# Patient Record
Sex: Male | Born: 1955
Health system: Southern US, Community
[De-identification: ages and names within clinical notes are randomized; demographics above are authoritative.]

## PROBLEM LIST (undated history)

## (undated) DIAGNOSIS — B1920 Unspecified viral hepatitis C without hepatic coma: Secondary | ICD-10-CM

---

## 2012-07-14 ENCOUNTER — Emergency Department (HOSPITAL_COMMUNITY): Payer: No Typology Code available for payment source

## 2012-07-14 ENCOUNTER — Encounter (HOSPITAL_COMMUNITY): Payer: Self-pay | Admitting: Emergency Medicine

## 2012-07-14 ENCOUNTER — Emergency Department (HOSPITAL_COMMUNITY)
Admission: EM | Admit: 2012-07-14 | Discharge: 2012-07-14 | Disposition: A | Discharge status: Home / Self Care | Payer: No Typology Code available for payment source | Attending: Emergency Medicine | Admitting: Emergency Medicine

## 2012-07-14 DIAGNOSIS — Y92410 Unspecified street and highway as the place of occurrence of the external cause: Secondary | ICD-10-CM | POA: Insufficient documentation

## 2012-07-14 DIAGNOSIS — IMO0002 Reserved for concepts with insufficient information to code with codable children: Secondary | ICD-10-CM | POA: Insufficient documentation

## 2012-07-14 DIAGNOSIS — S0990XA Unspecified injury of head, initial encounter: Secondary | ICD-10-CM | POA: Insufficient documentation

## 2012-07-14 DIAGNOSIS — F17200 Nicotine dependence, unspecified, uncomplicated: Secondary | ICD-10-CM | POA: Insufficient documentation

## 2012-07-14 DIAGNOSIS — Y9389 Activity, other specified: Secondary | ICD-10-CM | POA: Insufficient documentation

## 2012-07-14 DIAGNOSIS — S0180XA Unspecified open wound of other part of head, initial encounter: Secondary | ICD-10-CM | POA: Insufficient documentation

## 2012-07-14 DIAGNOSIS — S0081XA Abrasion of other part of head, initial encounter: Secondary | ICD-10-CM

## 2012-07-14 DIAGNOSIS — Z8619 Personal history of other infectious and parasitic diseases: Secondary | ICD-10-CM | POA: Insufficient documentation

## 2012-07-14 DIAGNOSIS — S0181XA Laceration without foreign body of other part of head, initial encounter: Secondary | ICD-10-CM

## 2012-07-14 HISTORY — DX: Unspecified viral hepatitis C without hepatic coma: B19.20

## 2012-07-14 MED ORDER — OXYCODONE-ACETAMINOPHEN 5-325 MG PO TABS
2.0000 | ORAL_TABLET | ORAL | Status: DC | PRN
Start: 2012-07-14 — End: 2012-07-22

## 2012-07-14 MED ORDER — OXYCODONE-ACETAMINOPHEN 5-325 MG PO TABS
2.0000 | ORAL_TABLET | Freq: Once | ORAL | Status: AC
  Administered 2012-07-14: 2 via ORAL
  Filled 2012-07-14: qty 2

## 2012-07-14 NOTE — ED Provider Notes (Signed)
LACERATION REPAIR Performed by: Johnnette Gourd Authorized by: Johnnette Gourd Consent: Verbal consent obtained. Risks and benefits: risks, benefits and alternatives were discussed Consent given by: patient Patient identity confirmed: provided demographic data Prepped and Draped in normal sterile fashion Wound explored  Laceration Location: right eyebrow region  Laceration Length: 2 cm  No Foreign Bodies seen or palpated  Anesthesia: local infiltration  Local anesthetic: lidocaine 2% without epinephrine  Anesthetic total: 3 ml  Irrigation method: syringe Amount of cleaning: standard  Skin closure: 5-0 prolene  Number of sutures: 3  Technique: simple interrupted  Patient tolerance: Patient tolerated the procedure well with no immediate complications.   Trevor Mace, PA-C 07/14/12 1715
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
## 2012-07-16 NOTE — ED Provider Notes (Signed)
Medical screening examination/treatment/procedure(s) were conducted as a shared visit with non-physician practitioner(s) and myself.  I personally evaluated the patient during the encounter  Toy Baker, MD 07/16/12 1940

## 2012-07-22 ENCOUNTER — Encounter (HOSPITAL_COMMUNITY): Payer: Self-pay | Admitting: Emergency Medicine

## 2012-07-22 ENCOUNTER — Emergency Department (HOSPITAL_COMMUNITY)
Admission: EM | Admit: 2012-07-22 | Discharge: 2012-07-22 | Disposition: A | Discharge status: Home / Self Care | Payer: Self-pay | Attending: Emergency Medicine | Admitting: Emergency Medicine

## 2012-07-22 DIAGNOSIS — Z4802 Encounter for removal of sutures: Secondary | ICD-10-CM | POA: Insufficient documentation

## 2012-07-22 DIAGNOSIS — Z8619 Personal history of other infectious and parasitic diseases: Secondary | ICD-10-CM | POA: Insufficient documentation

## 2012-07-22 DIAGNOSIS — F17200 Nicotine dependence, unspecified, uncomplicated: Secondary | ICD-10-CM | POA: Insufficient documentation
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
## 2012-07-22 NOTE — ED Provider Notes (Signed)
Medical screening examination/treatment/procedure(s) were performed by non-physician practitioner and as supervising physician I was immediately available for consultation/collaboration.  Flint Melter, MD 07/22/12 2059
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
## 2017-11-03 ENCOUNTER — Other Ambulatory Visit: Payer: Self-pay

## 2017-11-03 ENCOUNTER — Emergency Department (HOSPITAL_COMMUNITY): Payer: 59

## 2017-11-03 ENCOUNTER — Inpatient Hospital Stay (HOSPITAL_COMMUNITY)
Admission: EM | Admit: 2017-11-03 | Discharge: 2017-11-05 | DRG: 194 | Discharge status: Against Medical Advice | Payer: 59 | Attending: Internal Medicine | Admitting: Internal Medicine

## 2017-11-03 ENCOUNTER — Encounter (HOSPITAL_COMMUNITY): Payer: Self-pay | Admitting: Emergency Medicine

## 2017-11-03 DIAGNOSIS — J449 Chronic obstructive pulmonary disease, unspecified: Secondary | ICD-10-CM | POA: Diagnosis present

## 2017-11-03 DIAGNOSIS — R600 Localized edema: Secondary | ICD-10-CM | POA: Diagnosis present

## 2017-11-03 DIAGNOSIS — R0603 Acute respiratory distress: Secondary | ICD-10-CM | POA: Diagnosis present

## 2017-11-03 DIAGNOSIS — J440 Chronic obstructive pulmonary disease with acute lower respiratory infection: Secondary | ICD-10-CM | POA: Diagnosis not present

## 2017-11-03 DIAGNOSIS — F1110 Opioid abuse, uncomplicated: Secondary | ICD-10-CM | POA: Diagnosis present

## 2017-11-03 DIAGNOSIS — J181 Lobar pneumonia, unspecified organism: Principal | ICD-10-CM | POA: Diagnosis present

## 2017-11-03 DIAGNOSIS — J189 Pneumonia, unspecified organism: Secondary | ICD-10-CM | POA: Diagnosis present

## 2017-11-03 DIAGNOSIS — F17210 Nicotine dependence, cigarettes, uncomplicated: Secondary | ICD-10-CM | POA: Diagnosis present

## 2017-11-03 DIAGNOSIS — I340 Nonrheumatic mitral (valve) insufficiency: Secondary | ICD-10-CM | POA: Diagnosis not present

## 2017-11-03 DIAGNOSIS — R0602 Shortness of breath: Secondary | ICD-10-CM | POA: Diagnosis not present

## 2017-11-03 DIAGNOSIS — D638 Anemia in other chronic diseases classified elsewhere: Secondary | ICD-10-CM | POA: Diagnosis present

## 2017-11-03 DIAGNOSIS — D649 Anemia, unspecified: Secondary | ICD-10-CM | POA: Diagnosis not present

## 2017-11-03 DIAGNOSIS — D692 Other nonthrombocytopenic purpura: Secondary | ICD-10-CM | POA: Diagnosis not present

## 2017-11-03 LAB — URINALYSIS, ROUTINE W REFLEX MICROSCOPIC
Bacteria, UA: NONE SEEN
Bilirubin Urine: NEGATIVE
Glucose, UA: NEGATIVE mg/dL
Ketones, ur: NEGATIVE mg/dL
Nitrite: POSITIVE — AB
Protein, ur: NEGATIVE mg/dL
pH: 6 (ref 5.0–8.0)

## 2017-11-03 LAB — COMPREHENSIVE METABOLIC PANEL
ALT: 43 U/L (ref 0–44)
AST: 48 U/L — ABNORMAL HIGH (ref 15–41)
Albumin: 2.8 g/dL — ABNORMAL LOW (ref 3.5–5.0)
Alkaline Phosphatase: 82 U/L (ref 38–126)
Anion gap: 4 — ABNORMAL LOW (ref 5–15)
BUN: 13 mg/dL (ref 8–23)
CHLORIDE: 99 mmol/L (ref 98–111)
CO2: 30 mmol/L (ref 22–32)
CREATININE: 0.79 mg/dL (ref 0.61–1.24)
Calcium: 8.3 mg/dL — ABNORMAL LOW (ref 8.9–10.3)
Glucose, Bld: 132 mg/dL — ABNORMAL HIGH (ref 70–99)
POTASSIUM: 4.2 mmol/L (ref 3.5–5.1)
Sodium: 133 mmol/L — ABNORMAL LOW (ref 135–145)
Total Bilirubin: 0.3 mg/dL (ref 0.3–1.2)
Total Protein: 7.1 g/dL (ref 6.5–8.1)

## 2017-11-03 LAB — CREATININE, SERUM: CREATININE: 0.71 mg/dL (ref 0.61–1.24)

## 2017-11-03 LAB — CBC WITH DIFFERENTIAL/PLATELET
Abs Immature Granulocytes: 0.03 10*3/uL (ref 0.00–0.07)
BASOS PCT: 1 %
Basophils Absolute: 0 10*3/uL (ref 0.0–0.1)
EOS ABS: 0.1 10*3/uL (ref 0.0–0.5)
EOS PCT: 1 %
HCT: 33.9 % — ABNORMAL LOW (ref 39.0–52.0)
Hemoglobin: 10.2 g/dL — ABNORMAL LOW (ref 13.0–17.0)
Immature Granulocytes: 1 %
Lymphocytes Relative: 35 %
Lymphs Abs: 2.3 10*3/uL (ref 0.7–4.0)
MCH: 29.8 pg (ref 26.0–34.0)
MCHC: 30.1 g/dL (ref 30.0–36.0)
MCV: 99.1 fL (ref 80.0–100.0)
MONOS PCT: 11 %
Monocytes Absolute: 0.7 10*3/uL (ref 0.1–1.0)
Neutro Abs: 3.4 10*3/uL (ref 1.7–7.7)
Neutrophils Relative %: 51 %
PLATELETS: 371 10*3/uL (ref 150–400)
RBC: 3.42 MIL/uL — ABNORMAL LOW (ref 4.22–5.81)
RDW: 14.6 % (ref 11.5–15.5)
WBC: 6.6 10*3/uL (ref 4.0–10.5)
nRBC: 0 % (ref 0.0–0.2)

## 2017-11-03 LAB — CBC
HEMATOCRIT: 33.1 % — AB (ref 39.0–52.0)
Hemoglobin: 10.5 g/dL — ABNORMAL LOW (ref 13.0–17.0)
MCH: 30.6 pg (ref 26.0–34.0)
MCHC: 31.7 g/dL (ref 30.0–36.0)
MCV: 96.5 fL (ref 80.0–100.0)
Platelets: 362 10*3/uL (ref 150–400)
RBC: 3.43 MIL/uL — ABNORMAL LOW (ref 4.22–5.81)
RDW: 14.7 % (ref 11.5–15.5)
WBC: 6.9 10*3/uL (ref 4.0–10.5)
nRBC: 0 % (ref 0.0–0.2)

## 2017-11-03 LAB — PROTIME-INR
INR: 1.08
Prothrombin Time: 13.9 seconds (ref 11.4–15.2)

## 2017-11-03 LAB — BRAIN NATRIURETIC PEPTIDE: B NATRIURETIC PEPTIDE 5: 49.1 pg/mL (ref 0.0–100.0)

## 2017-11-03 LAB — I-STAT CG4 LACTIC ACID, ED: Lactic Acid, Venous: 0.96 mmol/L (ref 0.5–1.9)

## 2017-11-03 MED ORDER — HEPARIN SODIUM (PORCINE) 5000 UNIT/ML IJ SOLN
5000.0000 [IU] | Freq: Three times a day (TID) | INTRAMUSCULAR | Status: DC
  Administered 2017-11-03 – 2017-11-04 (×2): 5000 [IU] via SUBCUTANEOUS
  Filled 2017-11-03 (×2): qty 1

## 2017-11-03 MED ORDER — IOPAMIDOL (ISOVUE-370) INJECTION 76%
INTRAVENOUS | Status: AC
  Filled 2017-11-03: qty 100

## 2017-11-03 MED ORDER — SODIUM CHLORIDE 0.9 % IV BOLUS
1000.0000 mL | Freq: Once | INTRAVENOUS | Status: AC
  Administered 2017-11-03: 1000 mL via INTRAVENOUS

## 2017-11-03 MED ORDER — LORAZEPAM 1 MG PO TABS: 1.0000 mg | ORAL_TABLET | ORAL | Status: DC | PRN

## 2017-11-03 MED ORDER — ONDANSETRON 4 MG PO TBDP: 4.0000 mg | ORAL_TABLET | Freq: Once | ORAL | Status: DC

## 2017-11-03 MED ORDER — SODIUM CHLORIDE 0.9 % IV SOLN
500.0000 mg | Freq: Once | INTRAVENOUS | Status: AC
  Administered 2017-11-03: 500 mg via INTRAVENOUS
  Filled 2017-11-03: qty 500

## 2017-11-03 MED ORDER — SODIUM CHLORIDE 0.9 % IV SOLN
1.0000 g | INTRAVENOUS | Status: DC
  Administered 2017-11-04: 1 g via INTRAVENOUS
  Filled 2017-11-03: qty 10

## 2017-11-03 MED ORDER — SODIUM CHLORIDE 0.9 % IV SOLN
1.0000 g | Freq: Once | INTRAVENOUS | Status: AC
  Administered 2017-11-03: 1 g via INTRAVENOUS
  Filled 2017-11-03: qty 10

## 2017-11-03 MED ORDER — IOPAMIDOL (ISOVUE-370) INJECTION 76%
100.0000 mL | Freq: Once | INTRAVENOUS | Status: AC | PRN
  Administered 2017-11-03: 100 mL via INTRAVENOUS

## 2017-11-03 MED ORDER — SODIUM CHLORIDE 0.9 % IV SOLN
500.0000 mg | INTRAVENOUS | Status: DC
  Administered 2017-11-04: 500 mg via INTRAVENOUS
  Filled 2017-11-03: qty 500

## 2017-11-03 MED ORDER — SODIUM CHLORIDE 0.9 % IV SOLN
INTRAVENOUS | Status: DC
  Administered 2017-11-03: via INTRAVENOUS

## 2017-11-03 NOTE — ED Triage Notes (Addendum)
C/o edema in legs from toes to hips- increasing shortness of breathing. Pale, purpura on legs Admits to using heroin every 6 hours, snorting - was clean for 11 yrs-

## 2017-11-03 NOTE — H&P (Signed)
History and Physical   Jose Shepherd ZOX:[id number] DOB: 02-02-1955 DOA: 11/03/2017  Referring MD/NP/PA: Dr. Alona Bene  PCP: Patient, No Pcp Per   Patient coming from: Home  Chief Complaint: Lower extremity edema  HPI: Jose Shepherd is a 62 y.o. male with medical history significant of hepatitis C and chronic heroin abuse who presented to the ER with progressive bilateral lower extremity edema all the way to the thigh.  Symptoms started a few weeks ago and has gotten worse.  Associated with some mild shortness of breath.  No fever or chills.  Patient is unable to walk around the house or even in the hospital.  Wife is worried that he is not been able to function adequately.  He denied any recent injury.  No history of heart disease.  No history of kidney disease as well.  Patient has had previous swelling of only one leg but now is bilateral and symmetrical.  Patient has been using heroine in the past.  He was clean for more than 10 years but then started back in June of this year and has been hooked up on it.  He has tried detox but has failed.  ED Course: Temperature is 98.3 blood pressure 101/53 pulse 74 respirate of 16 oxygen sat 90% on room air.  White count is 6.9 hemoglobin 10.5 and platelet 362 sodium 133 potassium 4.2 chloride 99 CO2 30 with BUN 13 and calcium 8.3 INR 1.08 and glucose 132 lactic acid 0.96.  CT angiogram of the chest showed no PE but Mucous plugging within the lower lobe airways bilaterally. Area of consolidation in the posterior left upper lobe/lingula compatible with early pneumonia.  Albumin is 2.8 otherwise LFTs appear to be within normal.  proBNP is only 49.  Patient is therefore being admitted with possible pneumonia.  He has received initial dose of antibiotic in the ER.  Review of Systems: As per HPI otherwise 10 point review of systems negative.    Past Medical History:  Diagnosis Date  . Hepatitis C     No past surgical history on file.   reports that he has  been smoking. He has never used smokeless tobacco. He reports that he has current or past drug history. He reports that he does not drink alcohol.  No Known Allergies  No family history on file.   Prior to Admission medications   Not on File    Physical Exam: Vitals:   11/03/17 1930 11/03/17 2000 11/03/17 2030 11/03/17 2215  BP: (!) 119/59 (!) 102/59 (!) 101/53 (!) 113/55  Pulse: 71 63 70 66  Resp: 13 12 11 16   Temp:      TempSrc:      SpO2: 96% 96% 90% 93%      Constitutional: NAD, calm, comfortable, cachectic Vitals:   11/03/17 1930 11/03/17 2000 11/03/17 2030 11/03/17 2215  BP: (!) 119/59 (!) 102/59 (!) 101/53 (!) 113/55  Pulse: 71 63 70 66  Resp: 13 12 11 16   Temp:      TempSrc:      SpO2: 96% 96% 90% 93%   Eyes: PERRL, lids and conjunctivae normal ENMT: Mucous membranes are moist. Posterior pharynx clear of any exudate or lesions.Normal dentition.  Neck: normal, supple, no masses, no thyromegaly Respiratory: clear to auscultation bilaterally, no wheezing, no crackles. Normal respiratory effort. No accessory muscle use.  Cardiovascular: Regular rate and rhythm, no murmurs / rubs / gallops. 3+ extremity edema. 2+ pedal pulses. No carotid bruits.  Abdomen: no tenderness,  no masses palpated. No hepatosplenomegaly. Bowel sounds positive.  Musculoskeletal: no clubbing / cyanosis. No joint deformity upper and lower extremities. Good ROM, no contractures. Normal muscle tone.  Skin: no rashes, lesions, ulcers. No induration Neurologic: CN 2-12 grossly intact. Sensation intact, DTR normal. Strength 5/5 in all 4.  Psychiatric: Normal judgment and insight. Alert and oriented x 3. Normal mood.     Labs on Admission: I have personally reviewed following labs and imaging studies  CBC: Recent Labs  Lab 11/03/17 1441 11/03/17 2245  WBC 6.6 6.9  NEUTROABS 3.4  --   HGB 10.2* 10.5*  HCT 33.9* 33.1*  MCV 99.1 96.5  PLT 371 362   Basic Metabolic Panel: Recent Labs  Lab  11/03/17 1441 11/03/17 2245  NA 133*  --   K 4.2  --   CL 99  --   CO2 30  --   GLUCOSE 132*  --   BUN 13  --   CREATININE 0.79 0.71  CALCIUM 8.3*  --    GFR: CrCl cannot be calculated (Unknown ideal weight.). Liver Function Tests: Recent Labs  Lab 11/03/17 1441  AST 48*  ALT 43  ALKPHOS 82  BILITOT 0.3  PROT 7.1  ALBUMIN 2.8*   No results for input(s): LIPASE, AMYLASE in the last 168 hours. No results for input(s): AMMONIA in the last 168 hours. Coagulation Profile: Recent Labs  Lab 11/03/17 1753  INR 1.08   Cardiac Enzymes: No results for input(s): CKTOTAL, CKMB, CKMBINDEX, TROPONINI in the last 168 hours. BNP (last 3 results) No results for input(s): PROBNP in the last 8760 hours. HbA1C: No results for input(s): HGBA1C in the last 72 hours. CBG: No results for input(s): GLUCAP in the last 168 hours. Lipid Profile: No results for input(s): CHOL, HDL, LDLCALC, TRIG, CHOLHDL, LDLDIRECT in the last 72 hours. Thyroid Function Tests: No results for input(s): TSH, T4TOTAL, FREET4, T3FREE, THYROIDAB in the last 72 hours. Anemia Panel: No results for input(s): VITAMINB12, FOLATE, FERRITIN, TIBC, IRON, RETICCTPCT in the last 72 hours. Urine analysis:    Component Value Date/Time   COLORURINE YELLOW 11/03/2017 1436   APPEARANCEUR HAZY (A) 11/03/2017 1436   LABSPEC >1.046 (H) 11/03/2017 1436   PHURINE 6.0 11/03/2017 1436   GLUCOSEU NEGATIVE 11/03/2017 1436   HGBUR SMALL (A) 11/03/2017 1436   BILIRUBINUR NEGATIVE 11/03/2017 1436   KETONESUR NEGATIVE 11/03/2017 1436   PROTEINUR NEGATIVE 11/03/2017 1436   NITRITE POSITIVE (A) 11/03/2017 1436   LEUKOCYTESUR LARGE (A) 11/03/2017 1436   Sepsis Labs: @LABRCNTIP (procalcitonin:4,lacticidven:4) )No results found for this or any previous visit (from the past 240 hour(s)).   Radiological Exams on Admission: Dg Chest 2 View  Result Date: 11/03/2017 CLINICAL DATA:  Shortness of breath EXAM: CHEST - 2 VIEW COMPARISON:   None. FINDINGS: Hyperinflation with coarse bilateral interstitial opacity, likely mild chronic interstitial change. Left lower lung opacity suspected to represent a lead external to the patient. No focal consolidation or effusion. Normal heart size. No pneumothorax. IMPRESSION: No active cardiopulmonary disease. Hyperinflation with suspected coarse chronic interstitial disease. Electronically Signed   By: Jasmine Pang M.D.   On: 11/03/2017 15:41   Ct Angio Chest Pe W/cm &/or Wo Cm  Result Date: 11/03/2017 CLINICAL DATA:  Shortness of breath EXAM: CT ANGIOGRAPHY CHEST WITH CONTRAST TECHNIQUE: Multidetector CT imaging of the chest was performed using the standard protocol during bolus administration of intravenous contrast. Multiplanar CT image reconstructions and MIPs were obtained to evaluate the vascular anatomy. CONTRAST:  ISOVUE-370  IOPAMIDOL (ISOVUE-370) INJECTION 76% COMPARISON:  Chest x-ray today FINDINGS: Cardiovascular: For no filling defects in the pulmonary arteries to suggest pulmonary emboli. Heart is normal size. Aorta is normal caliber. Mediastinum/Nodes: No mediastinal, hilar, or axillary adenopathy. Lungs/Pleura: Mucous plugging within bilateral lower lobe airways. Moderate emphysema. Airspace consolidation in the posterior left lingula, likely early infiltrate/pneumonia. No effusions. Upper Abdomen: Imaging into the upper abdomen shows no acute findings. Musculoskeletal: Chest wall soft tissues are unremarkable. No acute bony abnormality. Review of the MIP images confirms the above findings. IMPRESSION: No evidence of pulmonary embolus. Mucous plugging within the lower lobe airways bilaterally. Area of consolidation in the posterior left upper lobe/lingula compatible with early pneumonia. Emphysema (ICD10-J43.9). Electronically Signed   By: Charlett Nose M.D.   On: 11/03/2017 19:29      Assessment/Plan Principal Problem:   Pneumonia Active Problems:   COPD (chronic obstructive  pulmonary disease) (HCC)   Edema of both legs   Anemia   Heroin abuse (HCC)     #1 pneumonia: Early pneumonia according to CT.  Empirically started on Rocephin and Zithromax.  Patient not very hypoxic.  Has significant COPD.  Continued treatment and transition to oral antibiotics.  #2 bilateral lower extremity edema: All the way to the thigh.  Patient will be evaluated for course.  We will get echocardiogram.  May need to rule out pelvic obstruction as bilateral lower extremity edema is extensive.  Liver does not appear to be implicated despite hepatitis C.  Initiate diuresis.  Elevate the legs bilaterally.  #3 COPD: No exacerbation.  Empiric nebulizer.  #4 anemia of chronic disease: Monitor H&H.  #5 heroin abuse: Monitor patient for possible withdrawal.  PRN Ativan  #6 history of hepatitis C: Per patient it is in remission.  No evidence of liver damage at this point.  Defer treatment to the outpatient setting.   DVT prophylaxis: Heparin Code Status: Full Family Communication: Wife that is with the patient Disposition Plan: To be determined Consults called: None Admission status: Inpatient  Severity of Illness: The appropriate patient status for this patient is INPATIENT. Inpatient status is judged to be reasonable and necessary in order to provide the required intensity of service to ensure the patient's safety. The patient's presenting symptoms, physical exam findings, and initial radiographic and laboratory data in the context of their chronic comorbidities is felt to place them at high risk for further clinical deterioration. Furthermore, it is not anticipated that the patient will be medically stable for discharge from the hospital within 2 midnights of admission. The following factors support the patient status of inpatient.   " The patient's presenting symptoms include shortness of breath and lower extremity edema. " The worrisome physical exam findings include massive 3+ pedal  edema up to the thigh. " The initial radiographic and laboratory data are worrisome because of evidence of pneumonia on chest x-ray. " The chronic co-morbidities include heroine abuse.   * I certify that at the point of admission it is my clinical judgment that the patient will require inpatient hospital care spanning beyond 2 midnights from the point of admission due to high intensity of service, high risk for further deterioration and high frequency of surveillance required.Lonia Blood MD [facility] Pager [phone]  If 7PM-7AM, please contact night-coverage [email or web address] Password [email or web address]  11/04/2017, 12:05 AM

## 2017-11-03 NOTE — ED Provider Notes (Signed)
Patient placed in Quick Look pathway, seen and evaluated   Chief Complaint: edema  HPI: Jose Shepherd is a 62 y.o. male who presents to the ED with bilateral lower extremity edema, shortness of breath, purple rash to upper legs, and facial swelling. Patient with hx of Hep. C. And heroin use. Patient reports being clean for 11 years but then started back in June 2019. He uses every 6 hours. Tried to detox at home but got really bad and started back.   ROS: Resp: shortness of breath   M/S; lower extremity swelling  Physical Exam:  BP 105/62 (BP Location: Left Arm)   Pulse 74   Temp 98.3 F (36.8 C) (Oral)   Resp 16   SpO2 92%    Gen: No distress  Neuro: Awake and Alert  Skin: pale, purpura upper legs  M/S: bilateral lower extremities with edema,   Initiation of care has begun. The patient has been counseled on the process, plan, and necessity for staying for the completion/evaluation, and the remainder of the medical screening examination    Janne Napoleon, NP 11/03/17 1444    Maia Plan, MD 11/03/17 [phone]

## 2017-11-04 ENCOUNTER — Inpatient Hospital Stay (HOSPITAL_COMMUNITY): Payer: 59

## 2017-11-04 DIAGNOSIS — J449 Chronic obstructive pulmonary disease, unspecified: Secondary | ICD-10-CM

## 2017-11-04 DIAGNOSIS — D649 Anemia, unspecified: Secondary | ICD-10-CM

## 2017-11-04 DIAGNOSIS — F1110 Opioid abuse, uncomplicated: Secondary | ICD-10-CM

## 2017-11-04 DIAGNOSIS — J181 Lobar pneumonia, unspecified organism: Principal | ICD-10-CM

## 2017-11-04 DIAGNOSIS — I340 Nonrheumatic mitral (valve) insufficiency: Secondary | ICD-10-CM

## 2017-11-04 DIAGNOSIS — R600 Localized edema: Secondary | ICD-10-CM

## 2017-11-04 LAB — ECHOCARDIOGRAM COMPLETE
Height: 71 in
Weight: 2320 oz

## 2017-11-04 LAB — COMPREHENSIVE METABOLIC PANEL
ALT: 40 U/L (ref 0–44)
AST: 39 U/L (ref 15–41)
Albumin: 2.4 g/dL — ABNORMAL LOW (ref 3.5–5.0)
Alkaline Phosphatase: 78 U/L (ref 38–126)
Anion gap: 6 (ref 5–15)
BUN: 10 mg/dL (ref 8–23)
CO2: 27 mmol/L (ref 22–32)
CREATININE: 0.65 mg/dL (ref 0.61–1.24)
Calcium: 8 mg/dL — ABNORMAL LOW (ref 8.9–10.3)
Chloride: 100 mmol/L (ref 98–111)
GFR calc Af Amer: >60 mL/min (ref >60)
Glucose, Bld: 124 mg/dL — ABNORMAL HIGH (ref 70–99)
Potassium: 4.1 mmol/L (ref 3.5–5.1)
SODIUM: 133 mmol/L — AB (ref 135–145)
Total Bilirubin: 0.3 mg/dL (ref 0.3–1.2)
Total Protein: 6.7 g/dL (ref 6.5–8.1)

## 2017-11-04 LAB — STREP PNEUMONIAE URINARY ANTIGEN: Strep Pneumo Urinary Antigen: NEGATIVE

## 2017-11-04 LAB — HIV ANTIBODY (ROUTINE TESTING W REFLEX): HIV Screen 4th Generation wRfx: NONREACTIVE

## 2017-11-04 MED ORDER — IPRATROPIUM-ALBUTEROL 0.5-2.5 (3) MG/3ML IN SOLN
3.0000 mL | Freq: Four times a day (QID) | RESPIRATORY_TRACT | Status: DC
  Administered 2017-11-04: 3 mL via RESPIRATORY_TRACT
  Filled 2017-11-04 (×3): qty 3

## 2017-11-04 MED ORDER — IPRATROPIUM-ALBUTEROL 0.5-2.5 (3) MG/3ML IN SOLN
3.0000 mL | Freq: Two times a day (BID) | RESPIRATORY_TRACT | Status: DC
  Administered 2017-11-05: 3 mL via RESPIRATORY_TRACT
  Filled 2017-11-04: qty 3

## 2017-11-04 MED ORDER — FUROSEMIDE 10 MG/ML IJ SOLN
40.0000 mg | Freq: Three times a day (TID) | INTRAMUSCULAR | Status: DC
  Administered 2017-11-04 – 2017-11-05 (×3): 40 mg via INTRAVENOUS
  Filled 2017-11-04 (×3): qty 4

## 2017-11-04 MED ORDER — ENOXAPARIN SODIUM 40 MG/0.4ML ~~LOC~~ SOLN
40.0000 mg | SUBCUTANEOUS | Status: DC
  Administered 2017-11-04: 40 mg via SUBCUTANEOUS
  Filled 2017-11-04 (×2): qty 0.4

## 2017-11-04 MED ORDER — BUDESONIDE 0.5 MG/2ML IN SUSP
0.5000 mg | Freq: Two times a day (BID) | RESPIRATORY_TRACT | Status: DC
  Administered 2017-11-04 – 2017-11-05 (×2): 0.5 mg via RESPIRATORY_TRACT
  Filled 2017-11-04 (×3): qty 2

## 2017-11-04 MED ORDER — FUROSEMIDE 10 MG/ML IJ SOLN
40.0000 mg | Freq: Two times a day (BID) | INTRAMUSCULAR | Status: DC
  Administered 2017-11-04: 40 mg via INTRAVENOUS
  Filled 2017-11-04: qty 4

## 2017-11-04 MED ORDER — IPRATROPIUM-ALBUTEROL 0.5-2.5 (3) MG/3ML IN SOLN: 3.0000 mL | RESPIRATORY_TRACT | Status: DC | PRN

## 2017-11-04 NOTE — Progress Notes (Signed)
PROGRESS NOTE    Jose Shepherd  NFA:[id number] DOB: 06-17-1955 DOA: 11/03/2017 PCP: Patient, No Pcp Per   Brief Narrative:  62 year old with history of hepatitis C and chronic heroin use came to the hospital complains of progressive bilateral lower extremity edema all the way up to his thighs which had been getting worse over the course of past several days along with exertional shortness of breath.  In the ER his vital signs were overall unremarkable but CTA of the chest was negative for pulmonary embolism but showed mucous plugging with concerns of possible early pneumonia.  There is clinical signs of volume overload therefore started on IV Lasix.   Assessment & Plan:   Principal Problem:   Pneumonia Active Problems:   COPD (chronic obstructive pulmonary disease) (HCC)   Edema of both legs   Anemia   Heroin abuse (HCC)  Acute respiratory distress, exertional Bilateral lower extremity edema Community-acquired pneumonia with underlying COPD - Continue patient on IV Rocephin or azithromycin -We will add nebulizer treatments scheduled and as necessary, Pulmicort - Incentive spirometry and flutter valve.  No need for oral steroids at this time - Check 2D echocardiogram, lower extremity Dopplers - Increase Lasix to 40 mg every 8 hours.  Monitor urine output. -Daily weights, strict input and output, fluid restriction 1800 cc -Blood cultures-pending - HIV-pending - Urine strep pneumonia-pending  Tobacco use and heroin use -Counseled to quit using tobacco and illicit drugs.  Anemia -Unspecified type, will defer this work-up as outpatient.  DVT prophylaxis: Subcutaneous Lovenox Code Status: Full code Family Communication: Wife and son at bedside Disposition Plan: Maintain inpatient stay for further IV diuresis and IV antibiotics.  He also needs continues bronchodilators.  Currently undergoing work-up for cause of fluid overload, needs echocardiogram and lower extremity  Dopplers  Consultants:   None  Procedures:   None  Antimicrobials:   Rocephin  Azithromycin   Subjective: Patient continues to insist he wants to go home but after extensively speaking with him with the help of his family patient is willing to stay in the hospital for further evaluation.  Does continue to report lower extremity tightness and swelling bilaterally.  Review of Systems Otherwise negative except as per HPI, including: General: Denies fever, chills, night sweats or unintended weight loss. Resp: Denies cough, wheezing, shortness of breath. Cardiac: Denies chest pain, palpitations, orthopnea, paroxysmal nocturnal dyspnea. GI: Denies abdominal pain, nausea, vomiting, diarrhea or constipation GU: Denies dysuria, frequency, hesitancy or incontinence MS: Denies muscle aches, joint pain or swelling Neuro: Denies headache, neurologic deficits (focal weakness, numbness, tingling), abnormal gait Psych: Denies anxiety, depression, SI/HI/AVH Skin: Denies new rashes or lesions ID: Denies sick contacts, exotic exposures, travel  Objective: Vitals:   11/03/17 2030 11/03/17 2215 11/04/17 0432 11/04/17 0618  BP: (!) 101/53 (!) 113/55 (!) 90/58 92/61  Pulse: 70 66 65 (!) 55  Resp: 11 16 16    Temp:  98.3 F (36.8 C) 97.7 F (36.5 C)   TempSrc:  Oral Oral   SpO2: 90% 93% 96%   Weight:  65.8 kg    Height:  5\' 11"  (1.803 m)      Intake/Output Summary (Last 24 hours) at 11/04/2017 1000 Last data filed at 11/04/2017 1000 Gross per 24 hour  Intake 637.97 ml  Output 460 ml  Net 177.97 ml   Filed Weights   11/03/17 2215  Weight: 65.8 kg    Examination:  General exam: Appears calm and comfortable  Respiratory system: Diffuse diminished breath sounds Cardiovascular system:  S1 & S2 heard, RRR. No JVD, murmurs, rubs, gallops or clicks.  Bilateral 3+ pitting edema up to his thighs with slight erythema but no warmth Gastrointestinal system: Abdomen is nondistended, soft and  nontender. No organomegaly or masses felt. Normal bowel sounds heard. Central nervous system: Alert and oriented. No focal neurological deficits. Extremities: Symmetric 5 x 5 power. Skin: Slight erythema bilateral lower extremity but no warmth. Psychiatry: Judgement and insight appear normal. Mood & affect appropriate.     Data Reviewed:   CBC: Recent Labs  Lab 11/03/17 1441 11/03/17 2245  WBC 6.6 6.9  NEUTROABS 3.4  --   HGB 10.2* 10.5*  HCT 33.9* 33.1*  MCV 99.1 96.5  PLT 371 362   Basic Metabolic Panel: Recent Labs  Lab 11/03/17 1441 11/03/17 2245 11/04/17 0004  NA 133*  --  133*  K 4.2  --  4.1  CL 99  --  100  CO2 30  --  27  GLUCOSE 132*  --  124*  BUN 13  --  10  CREATININE 0.79 0.71 0.65  CALCIUM 8.3*  --  8.0*   GFR: Estimated Creatinine Clearance: 90.2 mL/min (by C-G formula based on SCr of 0.65 mg/dL). Liver Function Tests: Recent Labs  Lab 11/03/17 1441 11/04/17 0004  AST 48* 39  ALT 43 40  ALKPHOS 82 78  BILITOT 0.3 0.3  PROT 7.1 6.7  ALBUMIN 2.8* 2.4*   No results for input(s): LIPASE, AMYLASE in the last 168 hours. No results for input(s): AMMONIA in the last 168 hours. Coagulation Profile: Recent Labs  Lab 11/03/17 1753  INR 1.08   Cardiac Enzymes: No results for input(s): CKTOTAL, CKMB, CKMBINDEX, TROPONINI in the last 168 hours. BNP (last 3 results) No results for input(s): PROBNP in the last 8760 hours. HbA1C: No results for input(s): HGBA1C in the last 72 hours. CBG: No results for input(s): GLUCAP in the last 168 hours. Lipid Profile: No results for input(s): CHOL, HDL, LDLCALC, TRIG, CHOLHDL, LDLDIRECT in the last 72 hours. Thyroid Function Tests: No results for input(s): TSH, T4TOTAL, FREET4, T3FREE, THYROIDAB in the last 72 hours. Anemia Panel: No results for input(s): VITAMINB12, FOLATE, FERRITIN, TIBC, IRON, RETICCTPCT in the last 72 hours. Sepsis Labs: Recent Labs  Lab 11/03/17 1800  LATICACIDVEN 0.96    No  results found for this or any previous visit (from the past 240 hour(s)).       Radiology Studies: Dg Chest 2 View  Result Date: 11/03/2017 CLINICAL DATA:  Shortness of breath EXAM: CHEST - 2 VIEW COMPARISON:  None. FINDINGS: Hyperinflation with coarse bilateral interstitial opacity, likely mild chronic interstitial change. Left lower lung opacity suspected to represent a lead external to the patient. No focal consolidation or effusion. Normal heart size. No pneumothorax. IMPRESSION: No active cardiopulmonary disease. Hyperinflation with suspected coarse chronic interstitial disease. Electronically Signed   By: Jasmine Pang M.D.   On: 11/03/2017 15:41   Ct Angio Chest Pe W/cm &/or Wo Cm  Result Date: 11/03/2017 CLINICAL DATA:  Shortness of breath EXAM: CT ANGIOGRAPHY CHEST WITH CONTRAST TECHNIQUE: Multidetector CT imaging of the chest was performed using the standard protocol during bolus administration of intravenous contrast. Multiplanar CT image reconstructions and MIPs were obtained to evaluate the vascular anatomy. CONTRAST:  ISOVUE-370 IOPAMIDOL (ISOVUE-370) INJECTION 76% COMPARISON:  Chest x-ray today FINDINGS: Cardiovascular: For no filling defects in the pulmonary arteries to suggest pulmonary emboli. Heart is normal size. Aorta is normal caliber. Mediastinum/Nodes: No mediastinal, hilar, or  axillary adenopathy. Lungs/Pleura: Mucous plugging within bilateral lower lobe airways. Moderate emphysema. Airspace consolidation in the posterior left lingula, likely early infiltrate/pneumonia. No effusions. Upper Abdomen: Imaging into the upper abdomen shows no acute findings. Musculoskeletal: Chest wall soft tissues are unremarkable. No acute bony abnormality. Review of the MIP images confirms the above findings. IMPRESSION: No evidence of pulmonary embolus. Mucous plugging within the lower lobe airways bilaterally. Area of consolidation in the posterior left upper lobe/lingula compatible with  early pneumonia. Emphysema (ICD10-J43.9). Electronically Signed   By: Charlett Nose M.D.   On: 11/03/2017 19:29        Scheduled Meds: . budesonide (PULMICORT) nebulizer solution  0.5 mg Nebulization BID  . furosemide  40 mg Intravenous Q8H  . heparin  5,000 Units Subcutaneous Q8H  . ipratropium-albuterol  3 mL Nebulization Q6H  . ondansetron  4 mg Oral Once   Continuous Infusions: . azithromycin    . cefTRIAXone (ROCEPHIN)  IV       LOS: 1 day   Time spent= 40 mins    Ankit Joline Maxcy, MD [facility] Pager [phone]   If 7PM-7AM, please contact night-coverage [email or web address] Password [email or web address] 11/04/2017, 10:00 AM

## 2017-11-05 ENCOUNTER — Encounter (HOSPITAL_COMMUNITY): Payer: 59

## 2017-11-05 LAB — BASIC METABOLIC PANEL
Anion gap: 6 (ref 5–15)
BUN: 10 mg/dL (ref 8–23)
CHLORIDE: 100 mmol/L (ref 98–111)
CO2: 31 mmol/L (ref 22–32)
Calcium: 7.9 mg/dL — ABNORMAL LOW (ref 8.9–10.3)
Creatinine, Ser: 0.73 mg/dL (ref 0.61–1.24)
GFR calc non Af Amer: >60 mL/min (ref >60)
Glucose, Bld: 114 mg/dL — ABNORMAL HIGH (ref 70–99)
POTASSIUM: 3.9 mmol/L (ref 3.5–5.1)
SODIUM: 137 mmol/L (ref 135–145)

## 2017-11-05 LAB — MAGNESIUM: Magnesium: 1.9 mg/dL (ref 1.7–2.4)

## 2017-11-05 NOTE — Discharge Summary (Signed)
Physician Discharge Summary  Jose Shepherd ZOX:[id number] DOB: Sep 23, 1955 DOA: 11/03/2017  PCP: Patient, No Pcp Per  Admit date: 11/03/2017 Discharge date: 11/05/2017  Admitted From: Home Disposition: Left AMA No medications given at the time of discharge as patient did not want it.  Brief/Interim Summary: 62 year old with history of hepatitis C and chronic heroin use came to the hospital complains of progressive bilateral lower extremity edema all the way up to his thighs which had been getting worse over the course of past several days along with exertional shortness of breath.  In the ER his vital signs were overall unremarkable but CTA of the chest was negative for pulmonary embolism but showed mucous plugging with concerns of possible early pneumonia.  There is clinical signs of volume overload therefore started on IV Lasix.  His echocardiogram showed ejection fraction 65 to 70%.  Lower extremity Dopplers were still pending.  Patient adamantly wanted to leave AGAINST MEDICAL ADVICE without any further work-up or medications to be taken at home.  His wife and son were present at the site as well during my and the nurses evaluation prior to patient leaving AGAINST MEDICAL ADVICE.  He was extensively instructed the importance of hospital stay and the necessary medical treatment.  He understood the consequences and still chose to leave.  Patient was alert awake oriented.  No suicidal or homicidal ideation. He does not have a PCP therefore I advised him to follow-up with [facility] as needed  Discharge Diagnoses:  Principal Problem:   Pneumonia Active Problems:   COPD (chronic obstructive pulmonary disease) (HCC)   Edema of both legs   Anemia   Heroin abuse (HCC)  Acute respiratory distress, exertional, slightly improved Bilateral lower extremity edema Community-acquired pneumonia with underlying COPD - Plan was to continue his IV antibiotics and nebulizer  treatments. -Echocardiogram showed ejection fraction 65 to 70%, lower extremity Dopplers were pending Patient left AMA  Tobacco use and heroin use -Counseled to quit using tobacco and illicit drugs.  Anemia -Unspecified type, will defer this work-up as outpatient.  Discharge Instructions   Allergies as of 11/05/2017   No Known Allergies     Medication List    You have not been prescribed any medications.     No Known Allergies  You were cared for by a hospitalist during your hospital stay. If you have any questions about your discharge medications or the care you received while you were in the hospital after you are discharged, you can call the unit and asked to speak with the hospitalist on call if the hospitalist that took care of you is not available. Once you are discharged, your primary care physician will handle any further medical issues. Please note that no refills for any discharge medications will be authorized once you are discharged, as it is imperative that you return to your primary care physician (or establish a relationship with a primary care physician if you do not have one) for your aftercare needs so that they can reassess your need for medications and monitor your lab values.  Consultations:  None   Procedures/Studies: Dg Chest 2 View  Result Date: 11/03/2017 CLINICAL DATA:  Shortness of breath EXAM: CHEST - 2 VIEW COMPARISON:  None. FINDINGS: Hyperinflation with coarse bilateral interstitial opacity, likely mild chronic interstitial change. Left lower lung opacity suspected to represent a lead external to the patient. No focal consolidation or effusion. Normal heart size. No pneumothorax. IMPRESSION: No active cardiopulmonary disease. Hyperinflation with suspected coarse  chronic interstitial disease. Electronically Signed   By: Jasmine Pang M.D.   On: 11/03/2017 15:41   Ct Angio Chest Pe W/cm &/or Wo Cm  Result Date: 11/03/2017 CLINICAL DATA:   Shortness of breath EXAM: CT ANGIOGRAPHY CHEST WITH CONTRAST TECHNIQUE: Multidetector CT imaging of the chest was performed using the standard protocol during bolus administration of intravenous contrast. Multiplanar CT image reconstructions and MIPs were obtained to evaluate the vascular anatomy. CONTRAST:  ISOVUE-370 IOPAMIDOL (ISOVUE-370) INJECTION 76% COMPARISON:  Chest x-ray today FINDINGS: Cardiovascular: For no filling defects in the pulmonary arteries to suggest pulmonary emboli. Heart is normal size. Aorta is normal caliber. Mediastinum/Nodes: No mediastinal, hilar, or axillary adenopathy. Lungs/Pleura: Mucous plugging within bilateral lower lobe airways. Moderate emphysema. Airspace consolidation in the posterior left lingula, likely early infiltrate/pneumonia. No effusions. Upper Abdomen: Imaging into the upper abdomen shows no acute findings. Musculoskeletal: Chest wall soft tissues are unremarkable. No acute bony abnormality. Review of the MIP images confirms the above findings. IMPRESSION: No evidence of pulmonary embolus. Mucous plugging within the lower lobe airways bilaterally. Area of consolidation in the posterior left upper lobe/lingula compatible with early pneumonia. Emphysema (ICD10-J43.9). Electronically Signed   By: Charlett Nose M.D.   On: 11/03/2017 19:29     Subjective: Patient did not want to stay in the hospital any further wanted to leave AGAINST MEDICAL ADVICE.  Wife and son present at bedside and were trying to convince the patient to stay as well but patient would not listen.  Currently he is not suicidal or homicidal.  He is awake alert and oriented.  He already ripped his IV out and insisting to leave.  General = no fevers, chills, dizziness, malaise, fatigue HEENT/EYES = negative for pain, redness, loss of vision, double vision, blurred vision, loss of hearing, sore throat, hoarseness, dysphagia Cardiovascular= negative for chest pain, palpitation, murmurs, lower  extremity swelling Respiratory/lungs= negative for shortness of breath, cough, hemoptysis, wheezing, mucus production Gastrointestinal= negative for nausea, vomiting,, abdominal pain, melena, hematemesis Genitourinary= negative for Dysuria, Hematuria, Change in Urinary Frequency MSK = Negative for arthralgia, myalgias, Back Pain, Joint swelling  Neurology= Negative for headache, seizures, numbness, tingling  Psychiatry= Negative for anxiety, depression, suicidal and homocidal ideation Allergy/Immunology= Medication/Food allergy as listed  Skin= Negative for Rash, lesions, ulcers, itching   Discharge Exam: Vitals:   11/05/17 0605 11/05/17 0816  BP: 103/66   Pulse: 65   Resp: 14   Temp: 97.7 F (36.5 C)   SpO2: 96% 96%   Vitals:   11/04/17 2126 11/05/17 0605 11/05/17 0607 11/05/17 0816  BP: 104/65 103/66    Pulse: 70 65    Resp: 16 14    Temp: 98.5 F (36.9 C) 97.7 F (36.5 C)    TempSrc: Oral Oral    SpO2: 93% 96%  96%  Weight:   67.2 kg   Height:       Physical exam: Refusing to have physical exam performed   The results of significant diagnostics from this hospitalization (including imaging, microbiology, ancillary and laboratory) are listed below for reference.     Microbiology: Recent Results (from the past 240 hour(s))  Culture, blood (routine x 2) Call MD if unable to obtain prior to antibiotics being given     Status: None (Preliminary result)   Collection Time: 11/03/17 10:47 PM  Result Value Ref Range Status   Specimen Description BLOOD LEFT ANTECUBITAL  Final   Special Requests   Final    BOTTLES DRAWN AEROBIC  AND ANAEROBIC Blood Culture adequate volume   Culture   Final    NO GROWTH 1 DAY Performed at [facility], [address] [place], [place] [address]    Report Status PENDING  Incomplete  Culture, blood (routine x 2) Call MD if unable to obtain prior to antibiotics being given     Status: None (Preliminary result)   Collection Time:  11/03/17 10:55 PM  Result Value Ref Range Status   Specimen Description BLOOD LEFT WRIST  Final   Special Requests   Final    BOTTLES DRAWN AEROBIC AND ANAEROBIC Blood Culture adequate volume   Culture   Final    NO GROWTH 1 DAY Performed at [facility], [address], [place], [place] [address]    Report Status PENDING  Incomplete     Labs: BNP (last 3 results) Recent Labs    11/03/17 1441  BNP 49.1   Basic Metabolic Panel: Recent Labs  Lab 11/03/17 1441 11/03/17 2245 11/04/17 0004 11/05/17 0343  NA 133*  --  133* 137  K 4.2  --  4.1 3.9  CL 99  --  100 100  CO2 30  --  27 31  GLUCOSE 132*  --  124* 114*  BUN 13  --  10 10  CREATININE 0.79 0.71 0.65 0.73  CALCIUM 8.3*  --  8.0* 7.9*  MG  --   --   --  1.9   Liver Function Tests: Recent Labs  Lab 11/03/17 1441 11/04/17 0004  AST 48* 39  ALT 43 40  ALKPHOS 82 78  BILITOT 0.3 0.3  PROT 7.1 6.7  ALBUMIN 2.8* 2.4*   No results for input(s): LIPASE, AMYLASE in the last 168 hours. No results for input(s): AMMONIA in the last 168 hours. CBC: Recent Labs  Lab 11/03/17 1441 11/03/17 2245  WBC 6.6 6.9  NEUTROABS 3.4  --   HGB 10.2* 10.5*  HCT 33.9* 33.1*  MCV 99.1 96.5  PLT 371 362   Cardiac Enzymes: No results for input(s): CKTOTAL, CKMB, CKMBINDEX, TROPONINI in the last 168 hours. BNP: Invalid input(s): POCBNP CBG: No results for input(s): GLUCAP in the last 168 hours. D-Dimer No results for input(s): DDIMER in the last 72 hours. Hgb A1c No results for input(s): HGBA1C in the last 72 hours. Lipid Profile No results for input(s): CHOL, HDL, LDLCALC, TRIG, CHOLHDL, LDLDIRECT in the last 72 hours. Thyroid function studies No results for input(s): TSH, T4TOTAL, T3FREE, THYROIDAB in the last 72 hours.  Invalid input(s): FREET3 Anemia work up No results for input(s): VITAMINB12, FOLATE, FERRITIN, TIBC, IRON, RETICCTPCT in the last 72 hours. Urinalysis    Component Value Date/Time    COLORURINE YELLOW 11/03/2017 1436   APPEARANCEUR HAZY (A) 11/03/2017 1436   LABSPEC >1.046 (H) 11/03/2017 1436   PHURINE 6.0 11/03/2017 1436   GLUCOSEU NEGATIVE 11/03/2017 1436   HGBUR SMALL (A) 11/03/2017 1436   BILIRUBINUR NEGATIVE 11/03/2017 1436   KETONESUR NEGATIVE 11/03/2017 1436   PROTEINUR NEGATIVE 11/03/2017 1436   NITRITE POSITIVE (A) 11/03/2017 1436   LEUKOCYTESUR LARGE (A) 11/03/2017 1436   Sepsis Labs Invalid input(s): PROCALCITONIN,  WBC,  LACTICIDVEN Microbiology Recent Results (from the past 240 hour(s))  Culture, blood (routine x 2) Call MD if unable to obtain prior to antibiotics being given     Status: None (Preliminary result)   Collection Time: 11/03/17 10:47 PM  Result Value Ref Range Status   Specimen Description BLOOD LEFT ANTECUBITAL  Final   Special Requests   Final    BOTTLES DRAWN AEROBIC AND ANAEROBIC Blood Culture adequate volume   Culture   Final    NO GROWTH 1 DAY Performed at [facility], [address] [place], [place] [address]    Report Status PENDING  Incomplete  Culture, blood (routine x 2) Call MD if unable to obtain prior to antibiotics being given     Status: None (Preliminary result)   Collection Time: 11/03/17 10:55 PM  Result Value Ref Range Status   Specimen Description BLOOD LEFT WRIST  Final   Special Requests   Final    BOTTLES DRAWN AEROBIC AND ANAEROBIC Blood Culture adequate volume   Culture   Final    NO GROWTH 1 DAY Performed at [facility], [address], [place], [place] [address]    Report Status PENDING  Incomplete     Time coordinating discharge:  I have spent 25 minutes face to face with the patient and on the ward discussing the patients care, assessment, plan and disposition with other care givers. >50% of the time was devoted counseling the patient about the risks and benefits of treatment/Discharge disposition and coordinating care.   SIGNED:   Dimple Nanas, MD  [facility] 11/05/2017, 11:55 AM Pager   If 7PM-7AM, please contact night-coverage [email or web address] Password [email or web address]

## 2017-11-05 NOTE — Progress Notes (Signed)
In to assess patient at this time.  Family now at bedside.  Smells of cigarettes in room patient denies smoking in room at this time and is asking for his Dr to be paged because he wants to leave AMA.  MD paged and awaiting return call.

## 2017-11-05 NOTE — Progress Notes (Signed)
MD at bedside talking with patient at this time.  IV removed and patient signed out AMA.  Informed telemetry and monitor removed.  Patient dressed and all belongings taken with patient at this time.

## 2017-11-08 NOTE — ED Provider Notes (Signed)
[facility] [address]  SURGICAL Provider Note   CSN: [id number] Arrival date & time: 11/03/17  1404     History   Chief Complaint Chief Complaint  Patient presents with  . Leg Swelling  . Facial Swelling    HPI Jose Shepherd is a 62 y.o. male.  HPI Patient presents to the emergency department with bilateral lower extremity swelling along with shortness of breath and fevers.  The patient has had this over the last 2 weeks.  The patient has been using heroin by snorting it.  He does not use IV drugs.  The patient states that he has been very weak as well.  The patient denies chest pain, shortness of breath, headache,blurred vision, neck pain, fever, cough, weakness, numbness, dizziness, anorexia, edema, abdominal pain, nausea, vomiting, diarrhea, rash, back pain, dysuria, hematemesis, bloody stool, near syncope, or syncope. Past Medical History:  Diagnosis Date  . Hepatitis C     Patient Active Problem List   Diagnosis Date Noted  . COPD (chronic obstructive pulmonary disease) (HCC) 11/03/2017  . Edema of both legs 11/03/2017  . Anemia 11/03/2017  . Heroin abuse (HCC) 11/03/2017  . Pneumonia 11/03/2017    No past surgical history on file.      Home Medications    Prior to Admission medications   Not on File    Family History No family history on file.  Social History Social History   Tobacco Use  . Smoking status: Current Every Day Smoker  . Smokeless tobacco: Never Used  Substance Use Topics  . Alcohol use: No  . Drug use: Yes    Comment: heroin - snorting every 6 hours.      Allergies   Patient has no known allergies.   Review of Systems Review of Systems All other systems negative except as documented in the HPI. All pertinent positives and negatives as reviewed in the HPI.  Physical Exam Updated Vital Signs BP 103/66 (BP Location: Right Arm)   Pulse 65   Temp 97.7 F (36.5 C) (Oral)   Resp 14   Ht 5\' 11"  (1.803 m)   Wt  67.2 kg   SpO2 96%   BMI 20.66 kg/m   Physical Exam  Constitutional: He is oriented to person, place, and time. He appears well-developed and well-nourished. No distress.  HENT:  Head: Normocephalic and atraumatic.  Mouth/Throat: Oropharynx is clear and moist.  Eyes: Pupils are equal, round, and reactive to light.  Neck: Normal range of motion. Neck supple.  Cardiovascular: Normal rate, regular rhythm and normal heart sounds. Exam reveals no gallop and no friction rub.  No murmur heard. Pulmonary/Chest: Effort normal. No respiratory distress. He has wheezes. He has rhonchi.  Abdominal: Soft. Bowel sounds are normal. He exhibits no distension. There is no tenderness.  Neurological: He is alert and oriented to person, place, and time. He exhibits normal muscle tone. Coordination normal.  Skin: Skin is warm and dry. Capillary refill takes less than 2 seconds. Petechiae, purpura and rash noted. No erythema.  Psychiatric: He has a normal mood and affect. His behavior is normal.  Nursing note and vitals reviewed.    ED Treatments / Results  Labs (all labs ordered are listed, but only abnormal results are displayed) Labs Reviewed  CBC WITH DIFFERENTIAL/PLATELET - Abnormal; Notable for the following components:      Result Value   RBC 3.42 (*)    Hemoglobin 10.2 (*)    HCT 33.9 (*)  All other components within normal limits  COMPREHENSIVE METABOLIC PANEL - Abnormal; Notable for the following components:   Sodium 133 (*)    Glucose, Bld 132 (*)    Calcium 8.3 (*)    Albumin 2.8 (*)    AST 48 (*)    Anion gap 4 (*)    All other components within normal limits  URINALYSIS, ROUTINE W REFLEX MICROSCOPIC - Abnormal; Notable for the following components:   APPearance HAZY (*)    Specific Gravity, Urine >1.046 (*)    Hgb urine dipstick SMALL (*)    Nitrite POSITIVE (*)    Leukocytes, UA LARGE (*)    WBC, UA >50 (*)    All other components within normal limits  COMPREHENSIVE  METABOLIC PANEL - Abnormal; Notable for the following components:   Sodium 133 (*)    Glucose, Bld 124 (*)    Calcium 8.0 (*)    Albumin 2.4 (*)    All other components within normal limits  CBC - Abnormal; Notable for the following components:   RBC 3.43 (*)    Hemoglobin 10.5 (*)    HCT 33.1 (*)    All other components within normal limits  BASIC METABOLIC PANEL - Abnormal; Notable for the following components:   Glucose, Bld 114 (*)    Calcium 7.9 (*)    All other components within normal limits  CULTURE, BLOOD (ROUTINE X 2)  CULTURE, BLOOD (ROUTINE X 2)  EXPECTORATED SPUTUM ASSESSMENT W REFEX TO RESP CULTURE  BRAIN NATRIURETIC PEPTIDE  PROTIME-INR  HIV ANTIBODY (ROUTINE TESTING W REFLEX)  STREP PNEUMONIAE URINARY ANTIGEN  CREATININE, SERUM  MAGNESIUM  I-STAT CG4 LACTIC ACID, ED    EKG EKG Interpretation  Date/Time:  Thursday November 03 2017 14:41:58 EDT Ventricular Rate:  69 PR Interval:  190 QRS Duration: 116 QT Interval:  424 QTC Calculation: 454 R Axis:   -80 Text Interpretation:  Normal sinus rhythm Possible Left atrial enlargement Indeterminate axis Incomplete right bundle branch block Nonspecific ST and T wave abnormality Abnormal ECG No previous ECGs available Confirmed by Frederick Peers [phone]) on 11/03/2017 4:52:21 PM Also confirmed by Frederick Peers [phone]), editor Jac Canavan, Beverly ([phone])  on 11/04/2017 10:52:42 AM   Radiology No results found.  Procedures Procedures (including critical care time)  Medications Ordered in ED Medications  iopamidol (ISOVUE-370) 76 % injection (has no administration in time range)  iopamidol (ISOVUE-370) 76 % injection 100 mL (100 mLs Intravenous Contrast Given 11/03/17 1911)  cefTRIAXone (ROCEPHIN) 1 g in sodium chloride 0.9 % 100 mL IVPB (0 g Intravenous Stopped 11/03/17 2218)  azithromycin (ZITHROMAX) 500 mg in sodium chloride 0.9 % 250 mL IVPB (0 mg Intravenous Stopped 11/03/17 2330)  sodium chloride 0.9 % bolus  1,000 mL (0 mLs Intravenous Stopped 11/03/17 2214)     Initial Impression / Assessment and Plan / ED Course  I have reviewed the triage vital signs and the nursing notes.  Pertinent labs & imaging results that were available during my care of the patient were reviewed by me and considered in my medical decision making (see chart for details).     He will need admission to the hospital for early pneumonia and his bilateral lower extremity swelling along with the fact that he has what appears to be purpura noted on his legs.  Started on the IV antibiotics for his pneumonia.  He is also had some low pulse oximetry readings but also has COPD.  Final Clinical Impressions(s) / ED Diagnoses  Final diagnoses:  Community acquired pneumonia of left lower lobe of lung (HCC)  Bilateral lower extremity edema  Purpura [facility])    ED Discharge Orders    None       Charlestine Night, PA-C 11/08/17 1609    Little, Ambrose Finland, MD 11/08/17 2352

## 2017-11-09 LAB — CULTURE, BLOOD (ROUTINE X 2)
CULTURE: NO GROWTH
CULTURE: NO GROWTH
Special Requests: ADEQUATE
Special Requests: ADEQUATE

## 2017-11-14 DIAGNOSIS — Z720 Tobacco use: Secondary | ICD-10-CM | POA: Diagnosis not present

## 2017-11-14 DIAGNOSIS — R609 Edema, unspecified: Secondary | ICD-10-CM | POA: Diagnosis not present

## 2017-11-14 DIAGNOSIS — D539 Nutritional anemia, unspecified: Secondary | ICD-10-CM | POA: Diagnosis not present

## 2017-11-14 DIAGNOSIS — R946 Abnormal results of thyroid function studies: Secondary | ICD-10-CM | POA: Diagnosis not present

## 2017-11-14 DIAGNOSIS — D649 Anemia, unspecified: Secondary | ICD-10-CM | POA: Diagnosis not present

## 2017-11-15 ENCOUNTER — Ambulatory Visit
Admission: RE | Admit: 2017-11-15 | Discharge: 2017-11-15 | Disposition: A | Discharge status: Home / Self Care | Payer: 59 | Source: Ambulatory Visit | Attending: Family Medicine | Admitting: Family Medicine

## 2017-11-15 ENCOUNTER — Other Ambulatory Visit: Payer: Self-pay | Admitting: Family Medicine

## 2017-11-15 DIAGNOSIS — J189 Pneumonia, unspecified organism: Secondary | ICD-10-CM

## 2017-11-15 DIAGNOSIS — D649 Anemia, unspecified: Secondary | ICD-10-CM

## 2017-11-15 DIAGNOSIS — R1909 Other intra-abdominal and pelvic swelling, mass and lump: Secondary | ICD-10-CM | POA: Diagnosis not present

## 2017-11-15 MED ORDER — IOPAMIDOL (ISOVUE-300) INJECTION 61%
100.0000 mL | Freq: Once | INTRAVENOUS | Status: AC | PRN
  Administered 2017-11-15: 100 mL via INTRAVENOUS

## 2017-11-18 DIAGNOSIS — D649 Anemia, unspecified: Secondary | ICD-10-CM | POA: Diagnosis not present

## 2017-11-18 DIAGNOSIS — R609 Edema, unspecified: Secondary | ICD-10-CM | POA: Diagnosis not present

## 2017-11-18 DIAGNOSIS — E039 Hypothyroidism, unspecified: Secondary | ICD-10-CM | POA: Diagnosis not present

## 2017-11-18 DIAGNOSIS — R935 Abnormal findings on diagnostic imaging of other abdominal regions, including retroperitoneum: Secondary | ICD-10-CM | POA: Diagnosis not present

## 2017-11-22 ENCOUNTER — Other Ambulatory Visit: Payer: Self-pay | Admitting: Family Medicine

## 2017-11-22 DIAGNOSIS — R935 Abnormal findings on diagnostic imaging of other abdominal regions, including retroperitoneum: Secondary | ICD-10-CM

## 2017-11-25 DIAGNOSIS — R600 Localized edema: Secondary | ICD-10-CM | POA: Diagnosis not present

## 2017-11-25 DIAGNOSIS — R8290 Unspecified abnormal findings in urine: Secondary | ICD-10-CM | POA: Diagnosis not present

## 2017-11-25 DIAGNOSIS — Z720 Tobacco use: Secondary | ICD-10-CM | POA: Diagnosis not present

## 2017-11-25 DIAGNOSIS — E039 Hypothyroidism, unspecified: Secondary | ICD-10-CM | POA: Diagnosis not present

## 2017-11-30 ENCOUNTER — Inpatient Hospital Stay: Admission: RE | Admit: 2017-11-30 | Payer: 59 | Source: Ambulatory Visit

## 2017-12-02 DIAGNOSIS — R600 Localized edema: Secondary | ICD-10-CM | POA: Diagnosis not present

## 2017-12-02 DIAGNOSIS — E039 Hypothyroidism, unspecified: Secondary | ICD-10-CM | POA: Diagnosis not present

## 2017-12-05 ENCOUNTER — Other Ambulatory Visit (HOSPITAL_COMMUNITY): Payer: Self-pay | Admitting: Family Medicine

## 2017-12-05 ENCOUNTER — Ambulatory Visit (HOSPITAL_COMMUNITY): Payer: 59

## 2017-12-05 DIAGNOSIS — R609 Edema, unspecified: Secondary | ICD-10-CM

## 2017-12-06 ENCOUNTER — Ambulatory Visit (HOSPITAL_COMMUNITY)
Admission: RE | Admit: 2017-12-06 | Discharge: 2017-12-06 | Disposition: A | Discharge status: Home / Self Care | Payer: 59 | Source: Ambulatory Visit | Attending: Family Medicine | Admitting: Family Medicine

## 2017-12-06 DIAGNOSIS — R609 Edema, unspecified: Secondary | ICD-10-CM | POA: Diagnosis not present

## 2017-12-06 NOTE — Progress Notes (Signed)
*  Preliminary Results* Bilateral lower extremity venous duplex completed. Bilateral lower extremities are negative for deep vein thrombosis. There is no evidence of right Baker's cyst, there is evidence of left Baker's cyst.  Incidental finding: multiple heterogenous areas in bilateral groins, suggestive of possible prominent inguinal lymph nodes.  12/06/2017 11:06 AM Gertie FeyMichelle Renad Jenniges, MHA, RVT, RDCS, RDMS

## 2017-12-11 ENCOUNTER — Ambulatory Visit
Admission: RE | Admit: 2017-12-11 | Discharge: 2017-12-11 | Disposition: A | Discharge status: Home / Self Care | Payer: 59 | Source: Ambulatory Visit | Attending: Family Medicine | Admitting: Family Medicine

## 2017-12-11 DIAGNOSIS — R935 Abnormal findings on diagnostic imaging of other abdominal regions, including retroperitoneum: Secondary | ICD-10-CM

## 2017-12-11 DIAGNOSIS — R932 Abnormal findings on diagnostic imaging of liver and biliary tract: Secondary | ICD-10-CM | POA: Diagnosis not present

## 2017-12-11 DIAGNOSIS — K838 Other specified diseases of biliary tract: Secondary | ICD-10-CM | POA: Diagnosis not present

## 2017-12-11 MED ORDER — GADOBENATE DIMEGLUMINE 529 MG/ML IV SOLN
14.0000 mL | Freq: Once | INTRAVENOUS | Status: AC | PRN
  Administered 2017-12-11: 14 mL via INTRAVENOUS

## 2017-12-12 DIAGNOSIS — R590 Localized enlarged lymph nodes: Secondary | ICD-10-CM | POA: Diagnosis not present

## 2017-12-12 DIAGNOSIS — D649 Anemia, unspecified: Secondary | ICD-10-CM | POA: Diagnosis not present

## 2017-12-12 DIAGNOSIS — R609 Edema, unspecified: Secondary | ICD-10-CM | POA: Diagnosis not present

## 2017-12-12 DIAGNOSIS — E039 Hypothyroidism, unspecified: Secondary | ICD-10-CM | POA: Diagnosis not present

## 2017-12-16 ENCOUNTER — Encounter: Payer: Self-pay | Admitting: Oncology

## 2017-12-16 ENCOUNTER — Telehealth: Payer: Self-pay | Admitting: Oncology

## 2017-12-16 NOTE — Telephone Encounter (Signed)
Correction: [facility]el BatonMiller[facility] office, not the pt, and scheduled the pt to see Dr. Clelia CroftShadad on 12/10 at 11am. Britta MccreedyBarbara will notify the pt. Letter mailed.

## 2017-12-16 NOTE — Telephone Encounter (Signed)
New hem appt has been scheduled for the pt to see Dr. Clelia CroftShadad on 12/10 at 11am. Pt agreed to the appt date and time. Letter mailed.

## 2017-12-20 ENCOUNTER — Inpatient Hospital Stay: Payer: 59 | Attending: Oncology | Admitting: Oncology

## 2017-12-20 VITALS — BP 94/56 | HR 69 | Temp 98.1°F | Resp 18 | Ht 71.0 in | Wt 147.4 lb

## 2017-12-20 DIAGNOSIS — F17200 Nicotine dependence, unspecified, uncomplicated: Secondary | ICD-10-CM | POA: Diagnosis not present

## 2017-12-20 DIAGNOSIS — D509 Iron deficiency anemia, unspecified: Secondary | ICD-10-CM

## 2017-12-20 DIAGNOSIS — D72821 Monocytosis (symptomatic): Secondary | ICD-10-CM

## 2017-12-20 DIAGNOSIS — E039 Hypothyroidism, unspecified: Secondary | ICD-10-CM

## 2017-12-20 DIAGNOSIS — Z79899 Other long term (current) drug therapy: Secondary | ICD-10-CM | POA: Diagnosis not present

## 2017-12-20 DIAGNOSIS — B1920 Unspecified viral hepatitis C without hepatic coma: Secondary | ICD-10-CM | POA: Diagnosis not present

## 2017-12-20 DIAGNOSIS — D649 Anemia, unspecified: Secondary | ICD-10-CM

## 2017-12-20 NOTE — Progress Notes (Signed)
Reason for the request:   Monocytosis  HPI: I was asked by Dr. Sabra Heck  to evaluate Jose Shepherd for monocytosis.  He is 62 year old man with history of hepatitis C, hypothyroidism was evaluated by his primary care physician in December 2019 after he was evaluated in the emergency department and hospitalized for pneumonia in October 2019.  On October 12, 2017 his CBC showed a white cell count of 6.4, hemoglobin of 10.5 and a platelet count that was normal.  He was found to have a monocytosis with monocyte percentage of 22.8%.  Laboratory data on November 25, 2017 showed ferritin of 83.8, iron 31 saturation of 7%.  In October 2019 his hemoglobin was 10.2 without any monocytosis.  Overall he is asymptomatic from these findings and does not report any recent infections or complaints.  He does report lower extremity edema with mild erythema noted.  He is continuing to take oral iron supplements without any issues.  Synthroid has been discontinued as of late.  He does not report any headaches, blurry vision, syncope or seizures. Does not report any fevers, chills or sweats.  Does not report any cough, wheezing or hemoptysis.  Does not report any chest pain, palpitation, orthopnea or leg edema.  Does not report any nausea, vomiting or abdominal pain.  Does not report any constipation or diarrhea.  Does not report any skeletal complaints.    Does not report frequency, urgency or hematuria.  Does not report any skin rashes or lesions. Does not report any heat or cold intolerance.  Does not report any lymphadenopathy or petechiae.  Does not report any anxiety or depression.  Remaining review of systems is negative.    Past Medical History:  Diagnosis Date  . Hepatitis C   :  His current medication including iron supplements.   No Known Allergies:  No family history of malignant disorders.  Social History   Socioeconomic History  . Marital status: Married    Spouse name: Not on file  . Number of children:  Not on file  . Years of education: Not on file  . Highest education level: Not on file  Occupational History  . Not on file  Social Needs  . Financial resource strain: Not on file  . Food insecurity:    Worry: Not on file    Inability: Not on file  . Transportation needs:    Medical: Not on file    Non-medical: Not on file  Tobacco Use  . Smoking status: Current Every Day Smoker  . Smokeless tobacco: Never Used  Substance and Sexual Activity  . Alcohol use: No  . Drug use: Yes    Comment: heroin - snorting every 6 hours.   . Sexual activity: Not on file  Lifestyle  . Physical activity:    Days per week: Not on file    Minutes per session: Not on file  . Stress: Not on file  Relationships  . Social connections:    Talks on phone: Not on file    Gets together: Not on file    Attends religious service: Not on file    Active member of club or organization: Not on file    Attends meetings of clubs or organizations: Not on file    Relationship status: Not on file  . Intimate partner violence:    Fear of current or ex partner: Not on file    Emotionally abused: Not on file    Physically abused: Not on file  Forced sexual activity: Not on file  Other Topics Concern  . Not on file  Social History Narrative  . Not on file  :  Pertinent items are noted in HPI.  Exam: Blood pressure (!) 94/56, pulse 69, temperature 98.1 F (36.7 C), temperature source Oral, resp. rate 18, height _0  (1.803 m), weight 147 lb 6.4 oz (66.9 kg), SpO2 96 %.  ECOG 1  General appearance: alert and cooperative appeared without distress. Head: atraumatic without any abnormalities. Eyes: conjunctivae/corneas clear. PERRL.  Sclera anicteric. Throat: lips, mucosa, and tongue normal; without oral thrush or ulcers. Resp: clear to auscultation bilaterally without rhonchi, wheezes or dullness to percussion. Cardio: regular rate and rhythm, S1, S2 normal.  Bilateral lower extremity edema noted. GI:  soft, non-tender; bowel sounds normal; no masses,  no organomegaly Skin: Skin color, texture, turgor normal. No rashes or lesions Lymph nodes: Cervical, supraclavicular, and axillary nodes normal. Neurologic: Grossly normal without any motor, sensory or deep tendon reflexes. Musculoskeletal: No joint deformity or effusion.    Mr Abdomen Mrcp W Wo Contast  Result Date: 12/12/2017 CLINICAL DATA:  Dilated biliary and pancreatic duct on prior CT. Patient with lower extremity edema. EXAM: MRI ABDOMEN WITHOUT AND WITH CONTRAST (INCLUDING MRCP) TECHNIQUE: Multiplanar multisequence MR imaging of the abdomen was performed both before and after the administration of intravenous contrast. Heavily T2-weighted images of the biliary and pancreatic ducts were obtained, and three-dimensional MRCP images were rendered by post processing. CONTRAST:  73m MULTIHANCE GADOBENATE DIMEGLUMINE 529 MG/ML IV SOLN COMPARISON:  11/15/2017 CT. FINDINGS: Mild to moderate motion degradation throughout. Lower chest: Mild cardiomegaly, without pericardial or pleural effusion. Hepatobiliary: Tiny liver lesions are likely cysts. Hepatomegaly at 18.3 cm craniocaudal. Cholecystectomy. No intrahepatic biliary duct dilatation. The previously described common duct dilatation has resolved. For example, the common duct measures 8 mm in the porta hepatis on image 16/9 and and in the pancreatic head on image 39/22. No choledocholithiasis or obstructive mass identified. Pancreas: The previously described pancreatic duct dilatation is improved to resolved. For example, the pancreatic duct measures maximally 4 mm on image 30/12. No pancreatic mass identified. Spleen:  Normal in size, without focal abnormality. Adrenals/Urinary Tract: Normal adrenal glands. Normal kidneys, without hydronephrosis. Stomach/Bowel: Normal appearance of the stomach and small bowel. Large colonic stool burden. Vascular/Lymphatic: Normal aortic caliber. Patent portal and  splenic veins. No abdominal adenopathy. Other:  No ascites. Musculoskeletal: Moderate convex right lumbar spine curvature with secondary degenerative change. IMPRESSION: 1. Motion degraded exam. 2. Resolution of biliary duct dilatation. Improvement to resolution of pancreatic duct dilatation. No obstructive stone or mass identified. 3.  Possible constipation. 4. Mild hepatomegaly. Electronically Signed   By: KAbigail MiyamotoM.D.   On: 12/12/2017 07:46   Vas UKoreaLower Extremity Venous (dvt)  Result Date: 12/06/2017  Lower Venous Study Indications: Edema.  Limitations: Poor ultrasound/tissue interface. Performing Technologist: MMaudry MayhewMHA, RDMS, RVT, RDCS  Examination Guidelines: A complete evaluation includes B-mode imaging, spectral Doppler, color Doppler, and power Doppler as needed of all accessible portions of each vessel. Bilateral testing is considered an integral part of a complete examination. Limited examinations for reoccurring indications may be performed as noted.  Right Venous Findings: +---------+---------------+---------+-----------+----------+------------------+          CompressibilityPhasicitySpontaneityPropertiesSummary            +---------+---------------+---------+-----------+----------+------------------+ CFV      Full           Yes      Yes                                     +---------+---------------+---------+-----------+----------+------------------+  SFJ      Full                                                            +---------+---------------+---------+-----------+----------+------------------+ FV Prox  Full                                                            +---------+---------------+---------+-----------+----------+------------------+ FV Mid   Full                                                            +---------+---------------+---------+-----------+----------+------------------+ FV DistalFull                                                             +---------+---------------+---------+-----------+----------+------------------+ PFV      Full                                                            +---------+---------------+---------+-----------+----------+------------------+ POP      Full           Yes      Yes                                     +---------+---------------+---------+-----------+----------+------------------+ PTV      Full                                         Limited evaluation +---------+---------------+---------+-----------+----------+------------------+ PERO     Full                                         Limited evaluation +---------+---------------+---------+-----------+----------+------------------+  Left Venous Findings: +---------+---------------+---------+-----------+----------+------------------+          CompressibilityPhasicitySpontaneityPropertiesSummary            +---------+---------------+---------+-----------+----------+------------------+ CFV      Full           Yes      Yes                                     +---------+---------------+---------+-----------+----------+------------------+ SFJ      Full                                                            +---------+---------------+---------+-----------+----------+------------------+  FV Prox  Full                                                            +---------+---------------+---------+-----------+----------+------------------+ FV Mid   Full                                                            +---------+---------------+---------+-----------+----------+------------------+ FV DistalFull                                                            +---------+---------------+---------+-----------+----------+------------------+ PFV      Full                                                             +---------+---------------+---------+-----------+----------+------------------+ POP      Full           Yes      Yes                                     +---------+---------------+---------+-----------+----------+------------------+ PTV      Full                                         Limited evaluation +---------+---------------+---------+-----------+----------+------------------+ PERO     Full                                         Limited evaluation +---------+---------------+---------+-----------+----------+------------------+    Summary: Right: There is no evidence of deep vein thrombosis in the lower extremity. However, portions of this examination were limited- see technologist comments above. No cystic structure found in the popliteal fossa. Ultrasound characteristics of enlarged lymph nodes are noted in the groin. Left: There is no evidence of deep vein thrombosis in the lower extremity. However, portions of this examination were limited- see technologist comments above. A cystic structure is found in the popliteal fossa. Ultrasound characteristics of enlarged lymph nodes noted in the groin.  *See table(s) above for measurements and observations. Electronically signed by Deitra Mayo MD on 12/06/2017 at 12:19:04 PM.    Final     Assessment and Plan:   62 year old man with the following:  1.  Monocytosis detected on a CBC in December 2019.  His white cell count is normal and differential otherwise is normal.  Previous CBC counts in October 2019 showed normal white cell count and normal differential.  The differential diagnosis of these findings were reviewed today and likely reactive in nature.  I see no evidence to suggest myelodysplastic syndrome or hematological condition.  I do not see any indication for bone marrow biopsy or hematological work-up.  Chronic inflammatory conditions such as hepatitis, recent infections among others can cause mild elevatio of his  monocytes.  I recommend continued monitoring for the time being.  2.  Iron deficiency: He has mild anemia and currently taking oral iron supplements.  I recommended continuing this at this time.  3.  Follow-up: No further hematology follow-up is needed at this time and happy to see him in the future as needed.  30  minutes was spent with the patient face-to-face today.  More than 50% of time was dedicated to reviewing operatory data, differential diagnosis and management options.   Thank you for the referral. A copy of this consult has been forwarded to the requesting physician.

## 2017-12-21 ENCOUNTER — Telehealth: Payer: Self-pay | Admitting: Oncology

## 2017-12-21 NOTE — Telephone Encounter (Signed)
Per 12/10 no los °

## 2018-01-11 DEATH — deceased

## 2019-01-23 IMAGING — CT CT ANGIO CHEST
2 of 8 series · 19 of 46 positions shown · IV contrast (iopamidol)
Comparison: Chest x-ray today

CLINICAL DATA: Shortness of breath

EXAM:
CT ANGIOGRAPHY CHEST WITH CONTRAST
TECHNIQUE: Multidetector CT imaging of the chest was performed using the
standard protocol during bolus administration of intravenous
contrast. Multiplanar CT image reconstructions and MIPs were
obtained to evaluate the vascular anatomy.
CONTRAST:  100mL LFWJ8I-RC9 IOPAMIDOL (LFWJ8I-RC9) INJECTION 76%

[Series 6: thins · axial · 0.68mm/px · z∈[+1134,+1455]mm · 16 of 355 slices shown]
[im 17/355  lung]
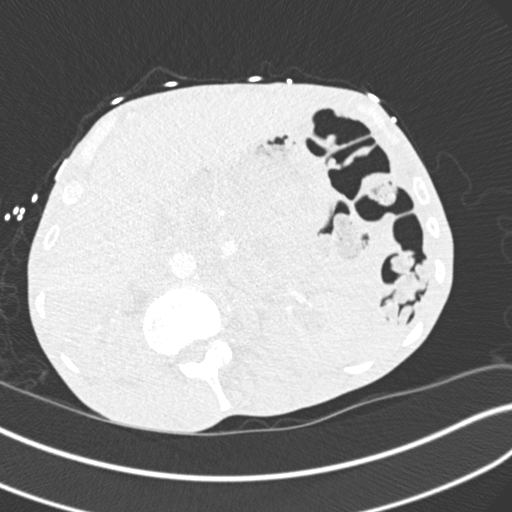
[im 33/355  soft-tissue]
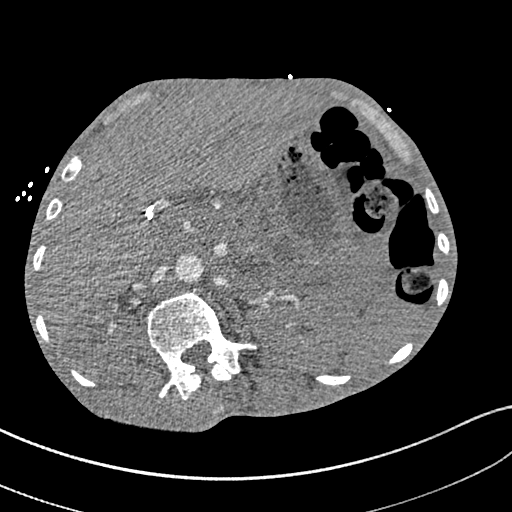
[im 65/355  lung]
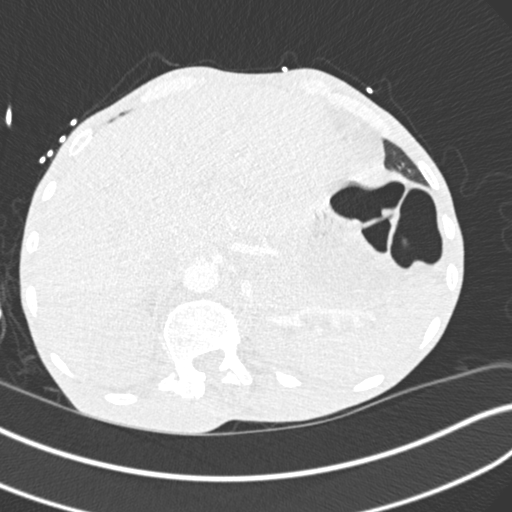
[im 81/355  soft-tissue]
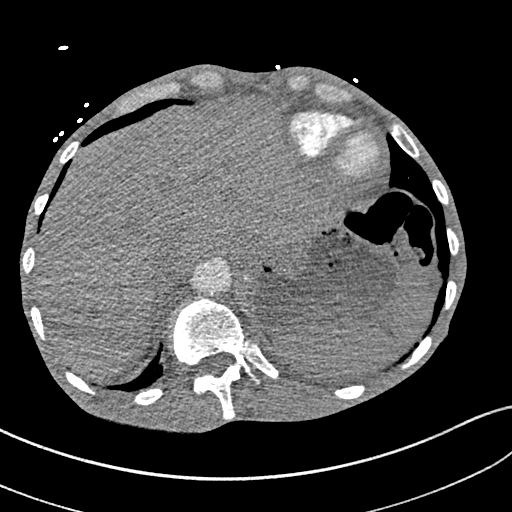
[im 97/355  lung]
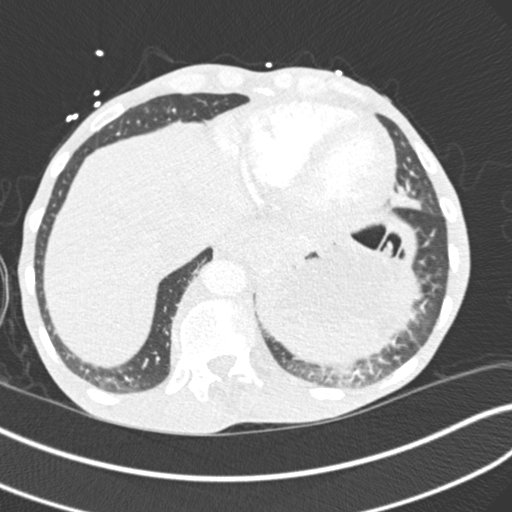
[im 129/355  soft-tissue]
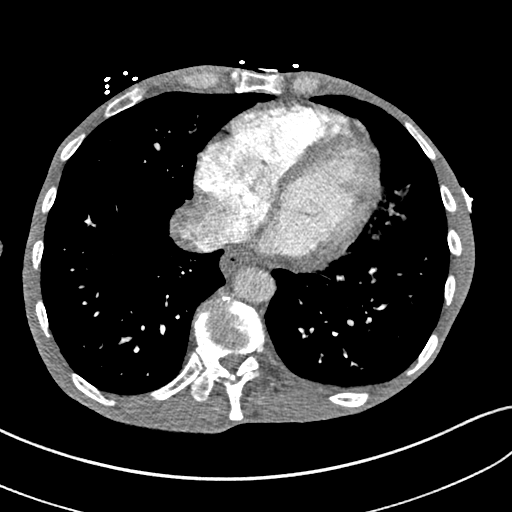
[im 145/355  lung]
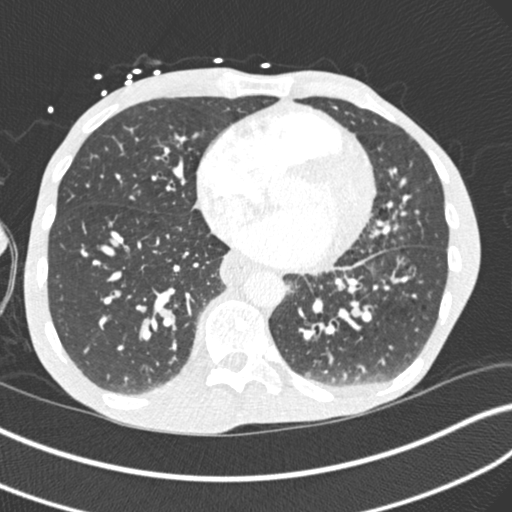
[im 161/355  soft-tissue]
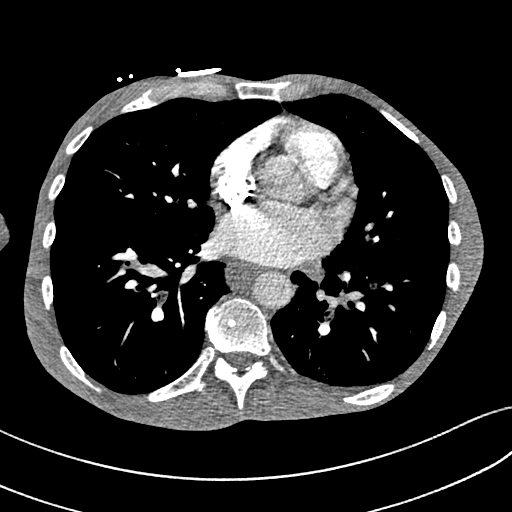
[im 194/355  lung]
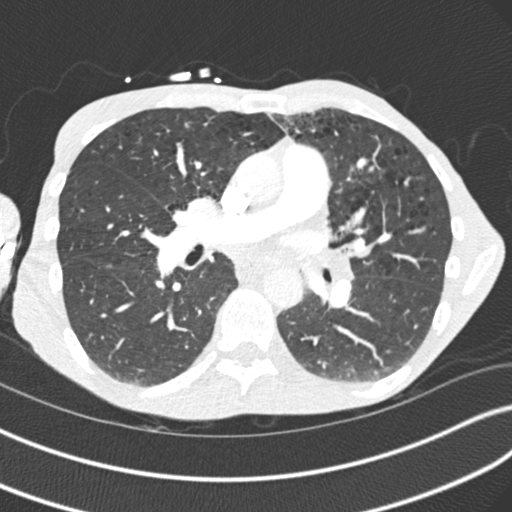
[im 210/355  soft-tissue]
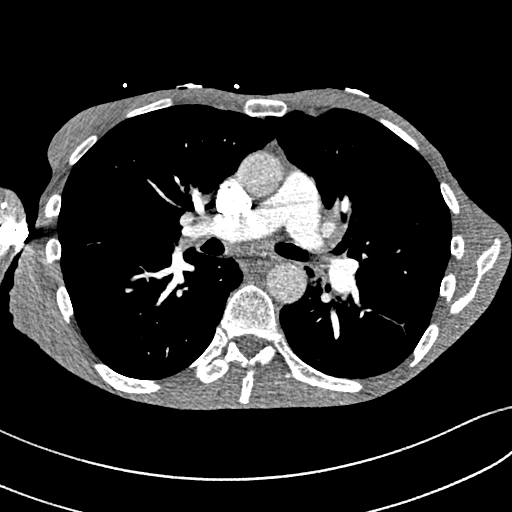
[im 226/355  lung]
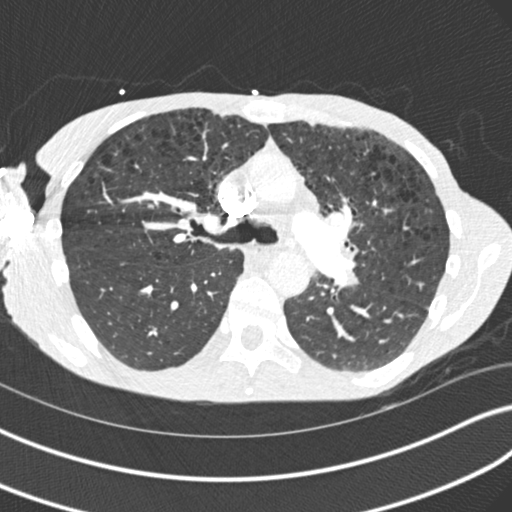
[im 258/355  soft-tissue]
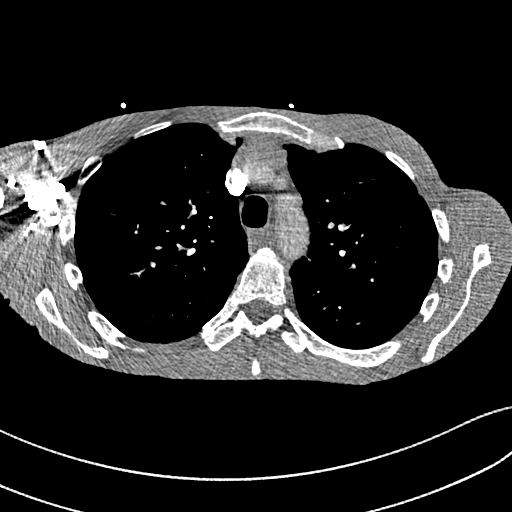
[im 274/355  lung]
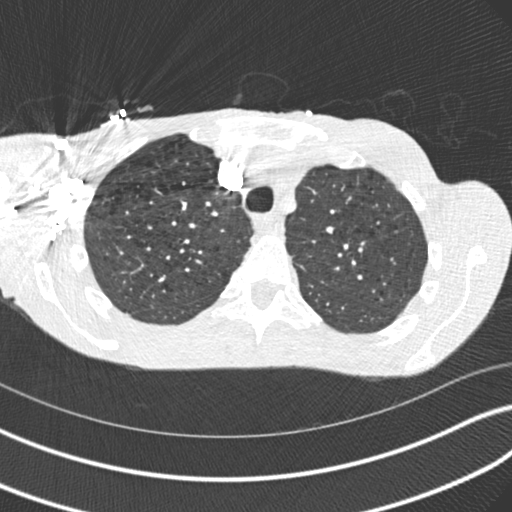
[im 290/355  soft-tissue]
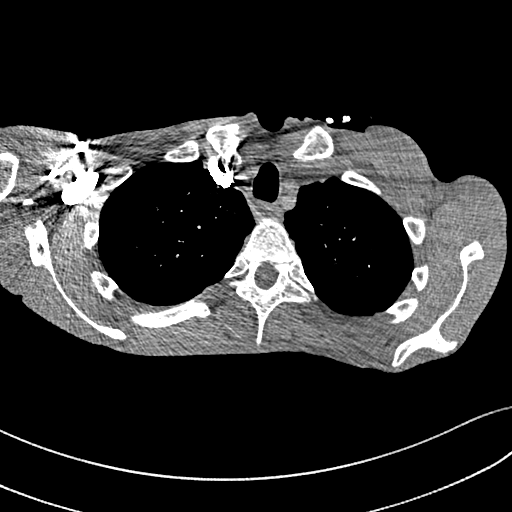
[im 322/355  lung]
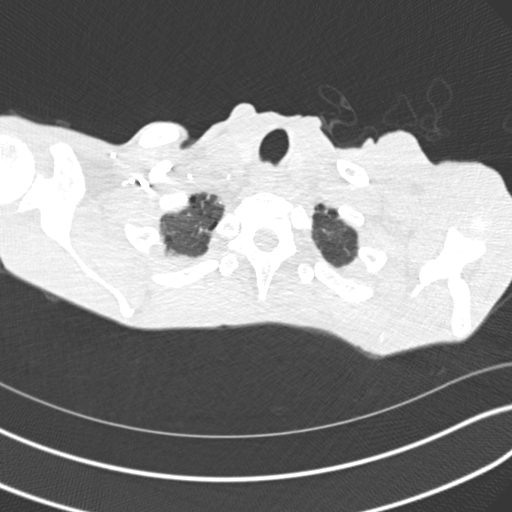
[im 338/355  soft-tissue]
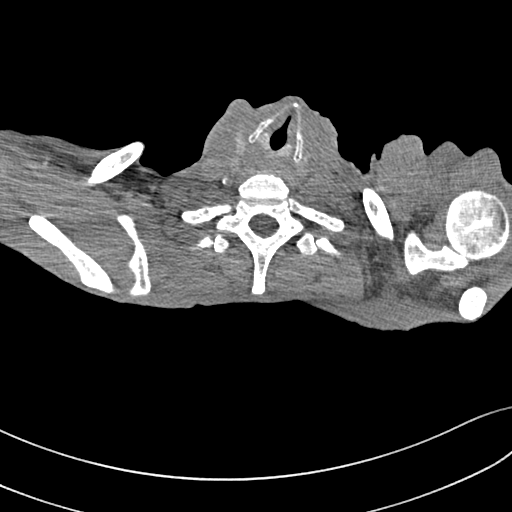

[Series 8: coronal mpr · coronal · 0.69mm/px · 3 of 149 slices shown]
[im 38/149  soft-tissue]
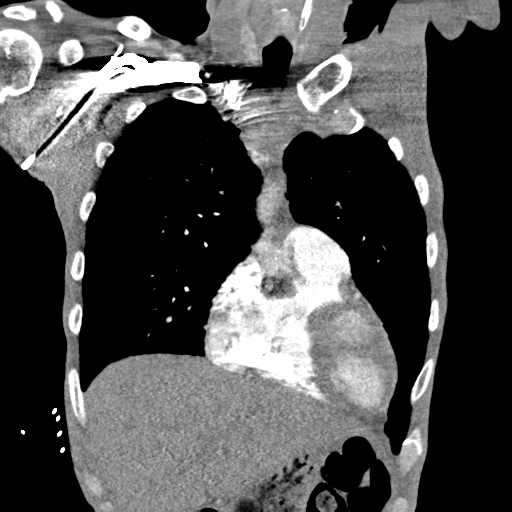
[im 75/149  soft-tissue]
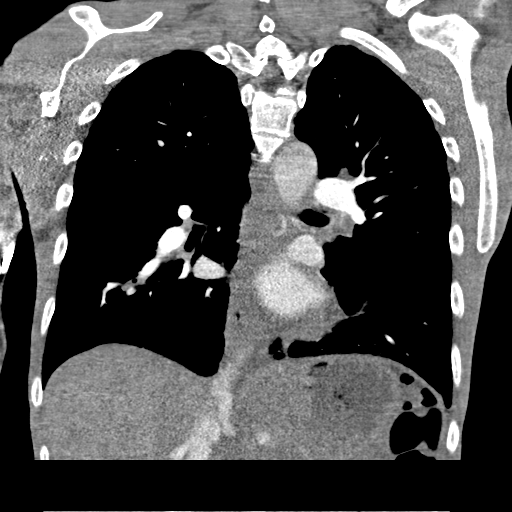
[im 112/149  soft-tissue]
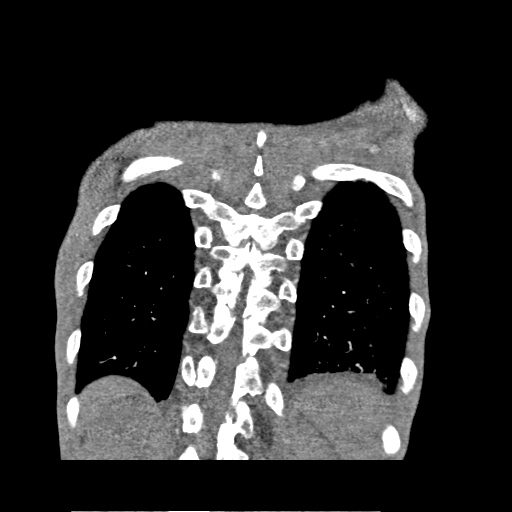

[19 of 46 positions shown; findings below may reference images not displayed]

FINDINGS: Cardiovascular: For no filling defects in the pulmonary arteries to
suggest pulmonary emboli. Heart is normal size. Aorta is normal
caliber.

Mediastinum/Nodes: No mediastinal, hilar, or axillary adenopathy.

Lungs/Pleura: Mucous plugging within bilateral lower lobe airways.
Moderate emphysema. Airspace consolidation in the posterior left
lingula, likely early infiltrate/pneumonia. No effusions.

Upper Abdomen: Imaging into the upper abdomen shows no acute
findings.

Musculoskeletal: Chest wall soft tissues are unremarkable. No acute
bony abnormality.

Review of the MIP images confirms the above findings.
IMPRESSION: No evidence of pulmonary embolus.

Mucous plugging within the lower lobe airways bilaterally. Area of
consolidation in the posterior left upper lobe/lingula compatible
with early pneumonia.

Emphysema ([id number]).

## 2019-02-04 IMAGING — CR DG CHEST 2V
2 series · 2 of 2 positions shown · non-contrast
Comparison: Chest x-ray and chest CT dated 11/03/2017

CLINICAL DATA: Pneumonia.

EXAM:
CHEST - 2 VIEW

[w chest pa]
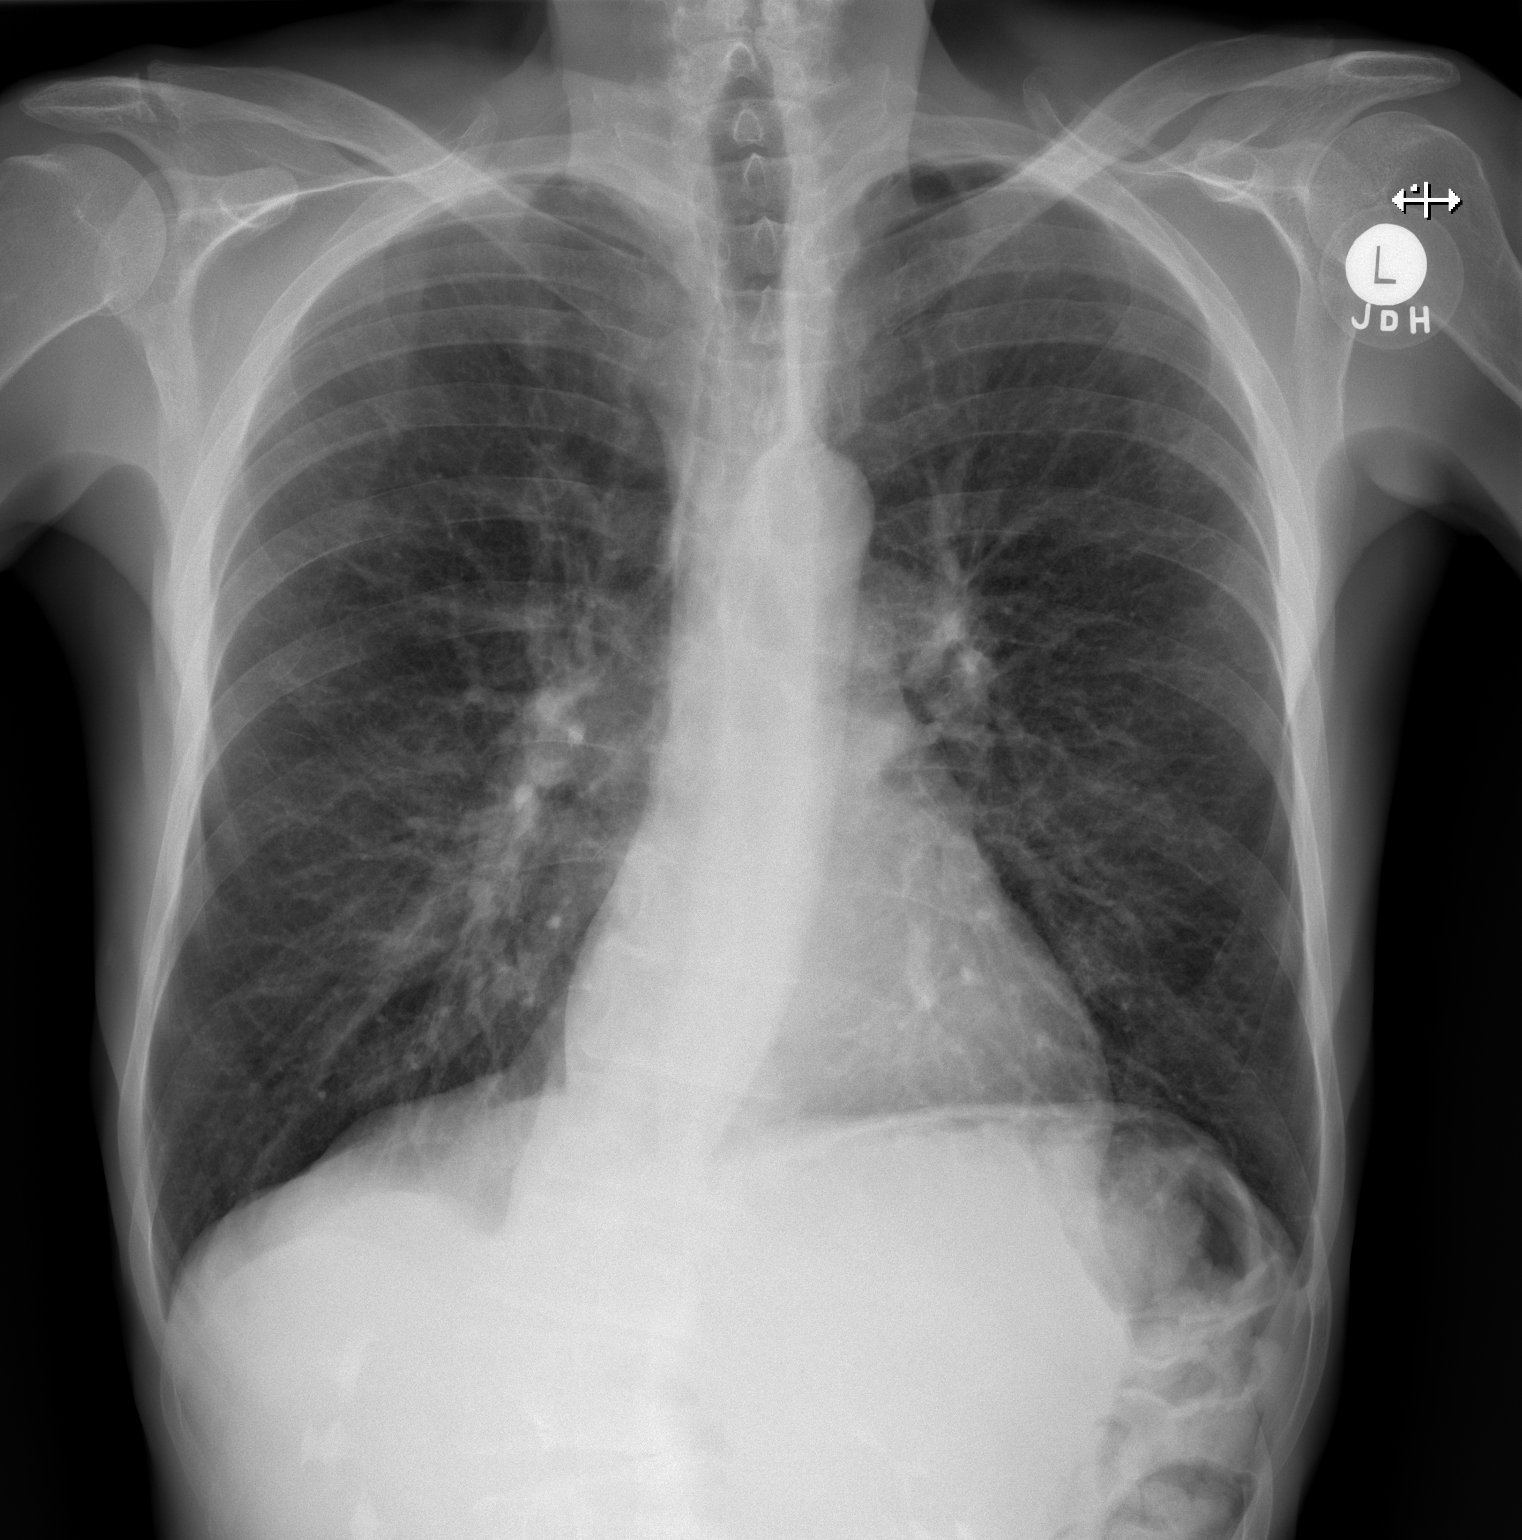

[w chest lat]
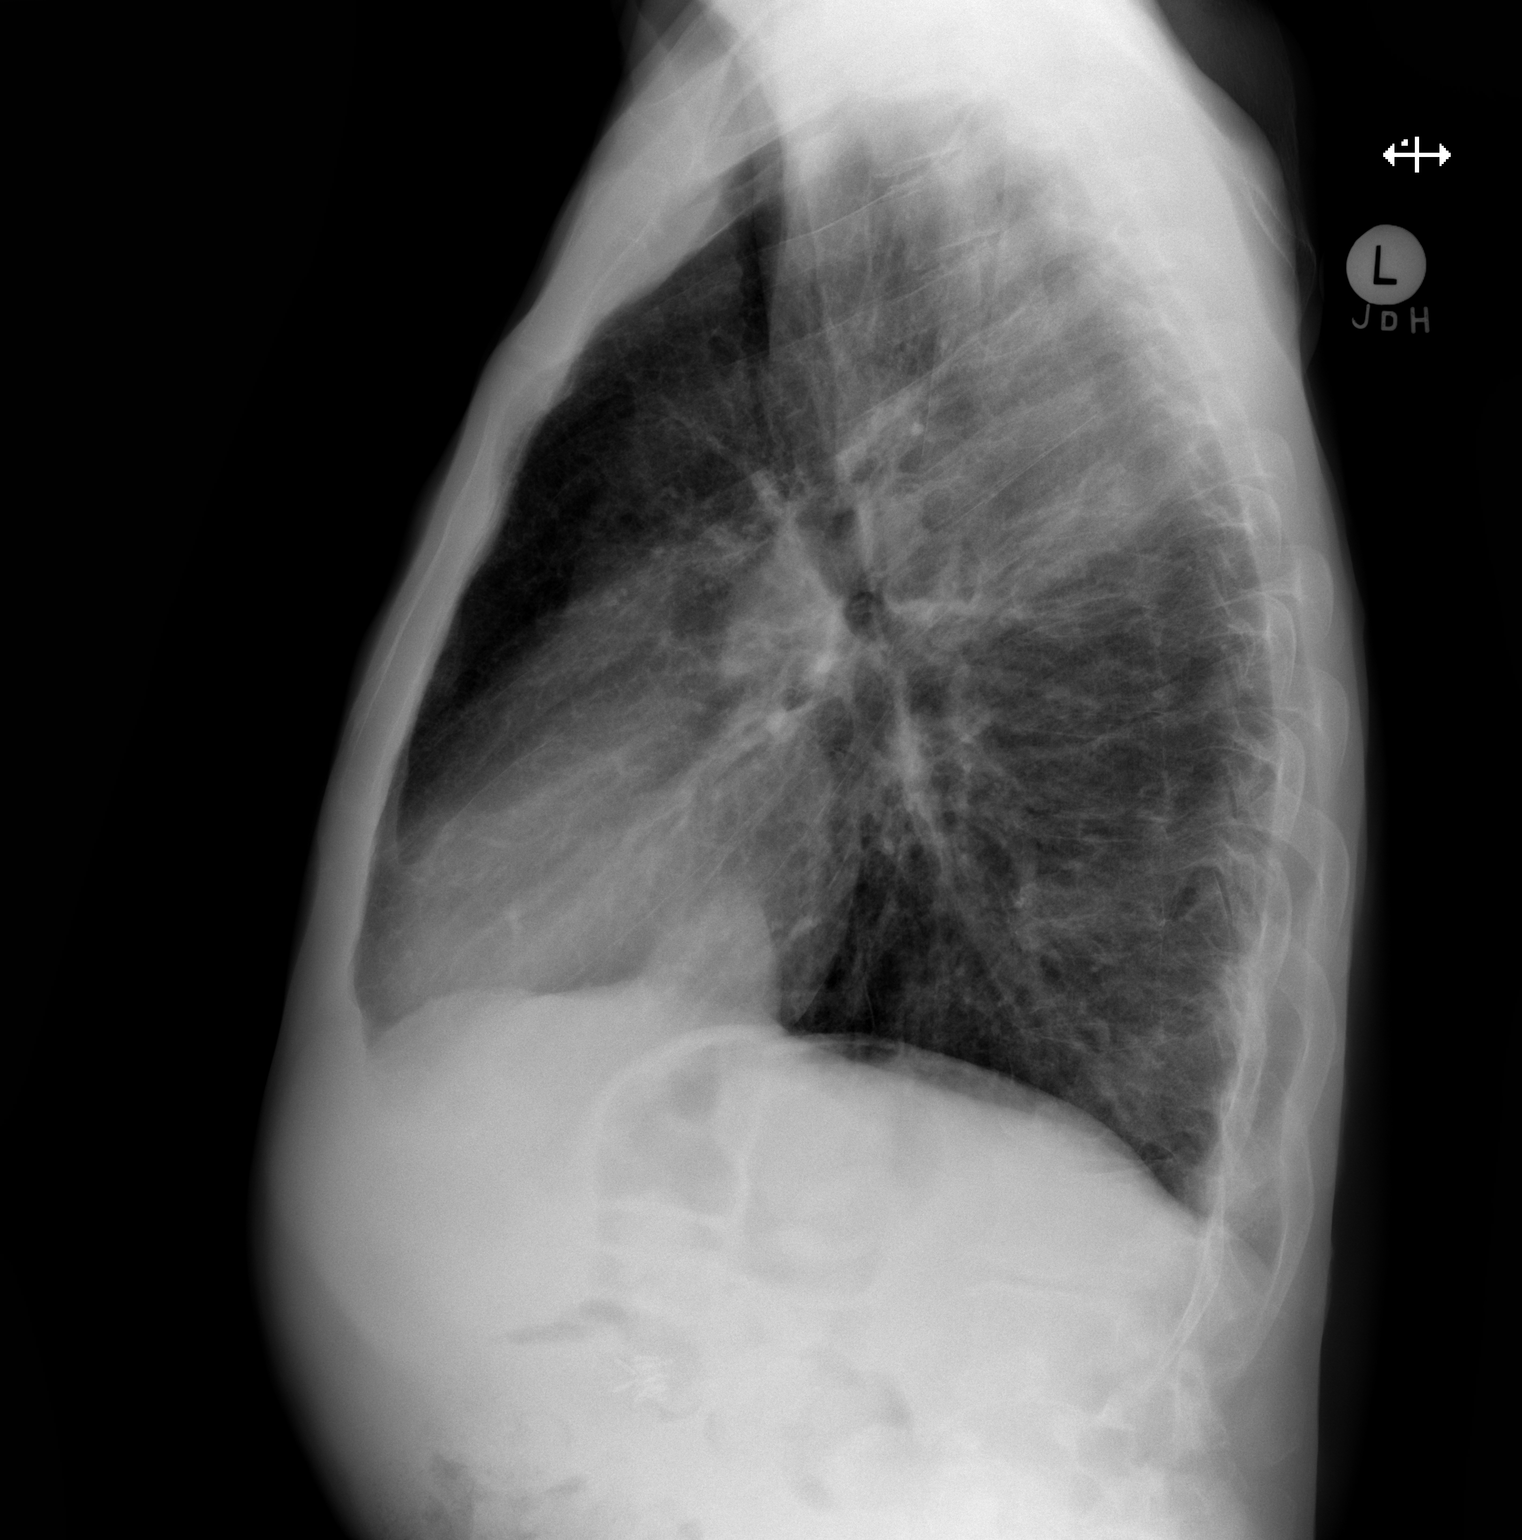

[2 of 2 positions shown; findings below may reference images not displayed]

FINDINGS: The focal area of infiltrate in the lingula demonstrated on the
prior exams has resolved. No acute infiltrates. Emphysematous
disease demonstrated on the prior CT scan is not apparent on the
chest x-ray other than slight hyperinflation of the lungs.

Heart size and pulmonary vascularity are normal. No acute bone
abnormality. Thoracolumbar scoliosis.
IMPRESSION: Clearing of the faint lingular infiltrate demonstrated on the prior
exams.

Emphysema.
